# Patient Record
Sex: Male | Born: 1960
Health system: Southern US, Community
[De-identification: ages and names within clinical notes are randomized; demographics above are authoritative.]

## PROBLEM LIST (undated history)

## (undated) ENCOUNTER — Ambulatory Visit: Source: Home / Self Care

## (undated) DIAGNOSIS — R7303 Prediabetes: Secondary | ICD-10-CM

## (undated) DIAGNOSIS — M4300 Spondylolysis, site unspecified: Secondary | ICD-10-CM

## (undated) DIAGNOSIS — I10 Essential (primary) hypertension: Secondary | ICD-10-CM

## (undated) DIAGNOSIS — K760 Fatty (change of) liver, not elsewhere classified: Secondary | ICD-10-CM

## (undated) DIAGNOSIS — M5030 Other cervical disc degeneration, unspecified cervical region: Secondary | ICD-10-CM

## (undated) DIAGNOSIS — M94262 Chondromalacia, left knee: Secondary | ICD-10-CM

## (undated) DIAGNOSIS — M109 Gout, unspecified: Secondary | ICD-10-CM

## (undated) DIAGNOSIS — K759 Inflammatory liver disease, unspecified: Secondary | ICD-10-CM

## (undated) HISTORY — DX: Essential (primary) hypertension: I10

## (undated) HISTORY — DX: Gout, unspecified: M10.9

---

## 2000-05-13 ENCOUNTER — Ambulatory Visit (HOSPITAL_COMMUNITY): Admission: RE | Admit: 2000-05-13 | Discharge: 2000-05-13 | Payer: Self-pay | Admitting: Family Medicine

## 2000-05-13 ENCOUNTER — Encounter: Payer: Self-pay | Admitting: Family Medicine

## 2001-12-31 ENCOUNTER — Ambulatory Visit (HOSPITAL_COMMUNITY): Admission: RE | Admit: 2001-12-31 | Discharge: 2001-12-31 | Payer: Self-pay | Admitting: Family Medicine

## 2001-12-31 ENCOUNTER — Encounter: Payer: Self-pay | Admitting: Family Medicine

## 2008-05-05 ENCOUNTER — Ambulatory Visit: Payer: Self-pay | Admitting: Gastroenterology

## 2008-07-06 ENCOUNTER — Encounter: Admission: RE | Admit: 2008-07-06 | Discharge: 2008-07-06 | Payer: Self-pay | Admitting: Internal Medicine

## 2008-07-27 ENCOUNTER — Ambulatory Visit (HOSPITAL_COMMUNITY): Admission: RE | Admit: 2008-07-27 | Discharge: 2008-07-27 | Payer: Self-pay | Admitting: Gastroenterology

## 2008-07-27 ENCOUNTER — Encounter (INDEPENDENT_AMBULATORY_CARE_PROVIDER_SITE_OTHER): Payer: Self-pay | Admitting: Interventional Radiology

## 2010-05-07 LAB — PROTIME-INR
INR: 1 (ref 0.00–1.49)
Prothrombin Time: 13.7 seconds (ref 11.6–15.2)

## 2010-05-07 LAB — CBC
HCT: 45.2 % (ref 39.0–52.0)
Hemoglobin: 15.4 g/dL (ref 13.0–17.0)
MCHC: 34.2 g/dL (ref 30.0–36.0)
MCV: 88.6 fL (ref 78.0–100.0)
Platelets: 202 10*3/uL (ref 150–400)
RBC: 5.1 MIL/uL (ref 4.22–5.81)
RDW: 12.3 % (ref 11.5–15.5)
WBC: 8.9 10*3/uL (ref 4.0–10.5)

## 2010-05-07 LAB — APTT: aPTT: 29 seconds (ref 24–37)

## 2010-10-15 IMAGING — CR DG CHEST 1V
1 series · 1 of 1 positions shown · non-contrast
Comparison: None

CLINICAL DATA: Pre employment physical.

CHEST - 1 VIEW

[view not recorded]
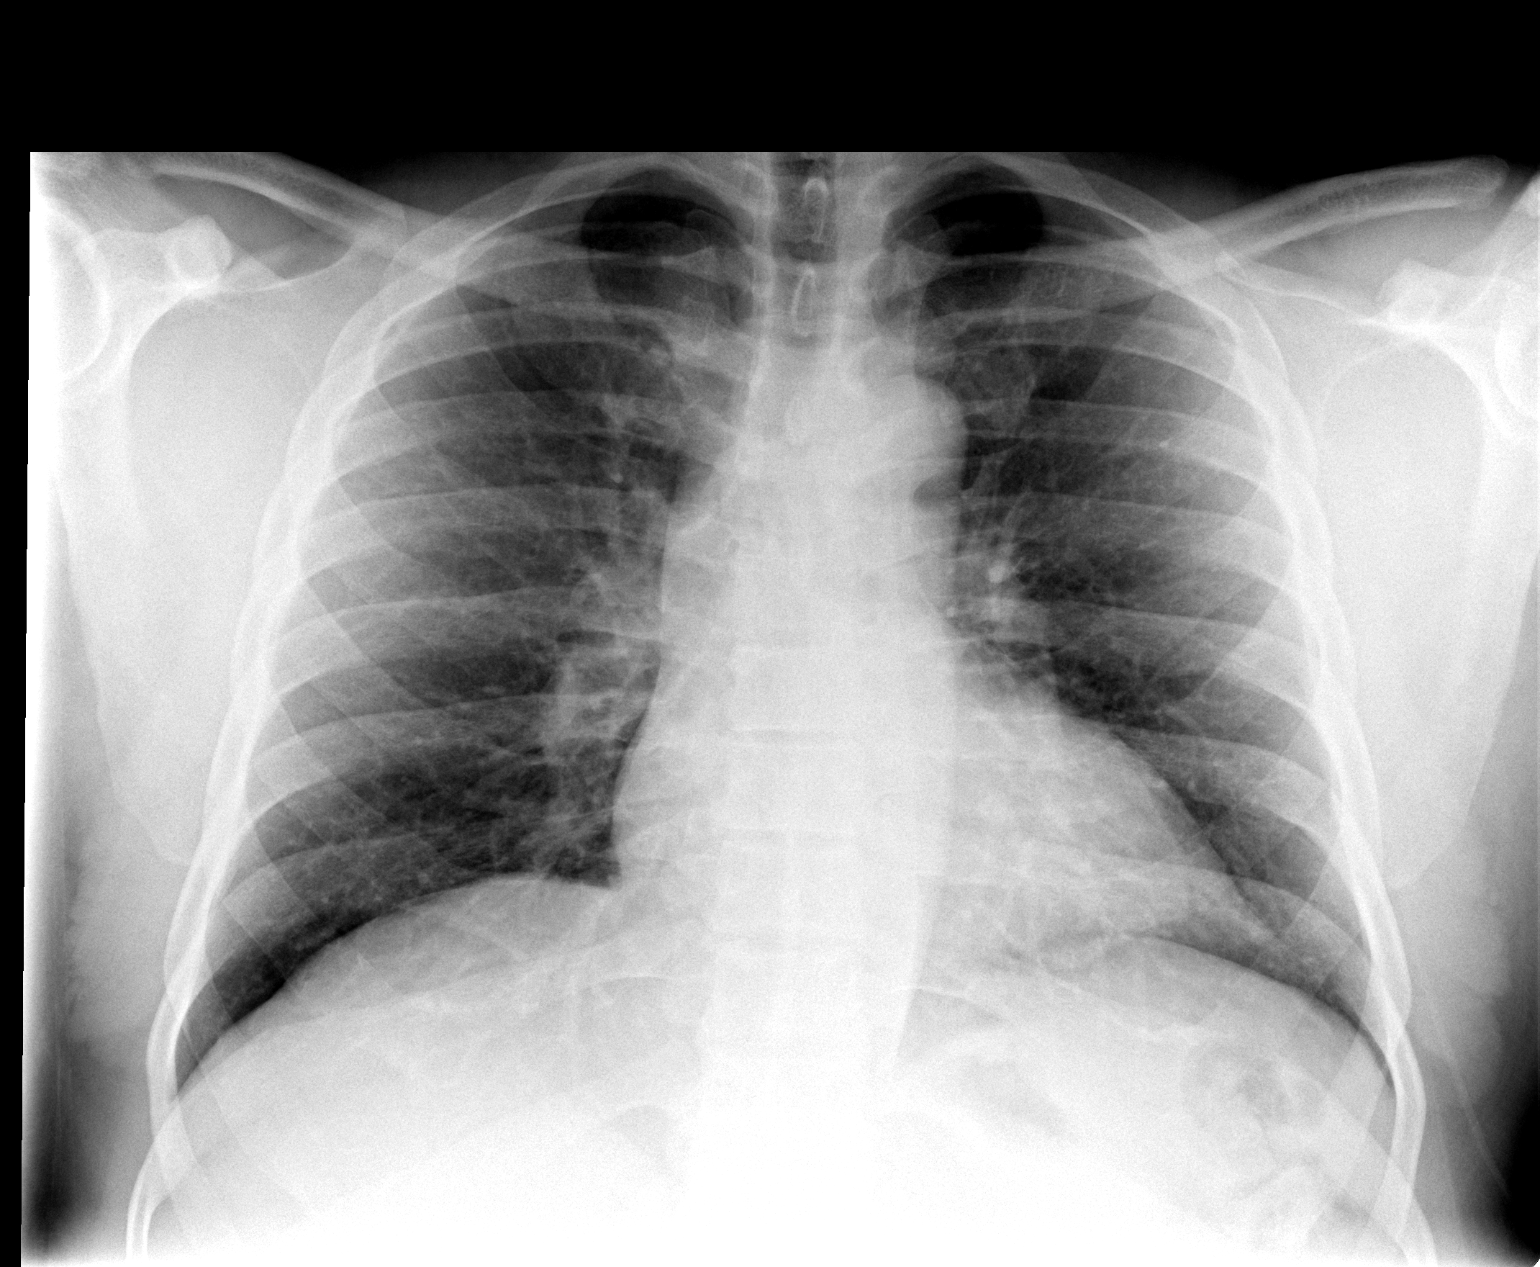

[1 of 1 positions shown; findings below may reference images not displayed]

FINDINGS: Trachea is midline.  Heart size normal.  Lungs are clear.
No pleural fluid.
IMPRESSION: No acute findings.

## 2012-05-20 ENCOUNTER — Other Ambulatory Visit: Payer: Self-pay | Admitting: Internal Medicine

## 2012-05-20 DIAGNOSIS — B1920 Unspecified viral hepatitis C without hepatic coma: Secondary | ICD-10-CM

## 2012-05-25 ENCOUNTER — Ambulatory Visit
Admission: RE | Admit: 2012-05-25 | Discharge: 2012-05-25 | Disposition: A | Discharge status: Home / Self Care | Payer: BC Managed Care – PPO | Source: Ambulatory Visit | Attending: Internal Medicine | Admitting: Internal Medicine

## 2012-05-25 DIAGNOSIS — B1920 Unspecified viral hepatitis C without hepatic coma: Secondary | ICD-10-CM

## 2012-08-02 ENCOUNTER — Ambulatory Visit (INDEPENDENT_AMBULATORY_CARE_PROVIDER_SITE_OTHER): Payer: BC Managed Care – PPO | Admitting: Physician Assistant

## 2012-08-02 VITALS — BP 132/82 | HR 84 | Temp 98.6°F | Resp 16 | Ht 72.0 in | Wt 266.0 lb

## 2012-08-02 DIAGNOSIS — M25572 Pain in left ankle and joints of left foot: Secondary | ICD-10-CM

## 2012-08-02 DIAGNOSIS — M109 Gout, unspecified: Secondary | ICD-10-CM

## 2012-08-02 DIAGNOSIS — M25579 Pain in unspecified ankle and joints of unspecified foot: Secondary | ICD-10-CM

## 2012-08-02 LAB — POCT CBC
Granulocyte percent: 61.7 %G (ref 37–80)
HCT, POC: 46 % (ref 43.5–53.7)
Hemoglobin: 14.9 g/dL (ref 14.1–18.1)
Lymph, poc: 3.1 (ref 0.6–3.4)
MCH, POC: 30.2 pg (ref 27–31.2)
MCHC: 32.4 g/dL (ref 31.8–35.4)
MCV: 93.2 fL (ref 80–97)
MID (cbc): 0.7 (ref 0–0.9)
MPV: 7.7 fL (ref 0–99.8)
POC Granulocyte: 6.1 (ref 2–6.9)
POC LYMPH PERCENT: 31.1 %L (ref 10–50)
POC MID %: 7.2 %M (ref 0–12)
Platelet Count, POC: 164 10*3/uL (ref 142–424)
RBC: 4.94 M/uL (ref 4.69–6.13)
RDW, POC: 12.9 %
WBC: 9.9 10*3/uL (ref 4.6–10.2)

## 2012-08-02 LAB — URIC ACID: Uric Acid, Serum: 7 mg/dL (ref 4.0–7.8)

## 2012-08-02 MED ORDER — HYDROCODONE-ACETAMINOPHEN 5-325 MG PO TABS: 1.0000 | ORAL_TABLET | Freq: Four times a day (QID) | ORAL | Status: DC | PRN

## 2012-08-02 MED ORDER — PREDNISONE 20 MG PO TABS: ORAL_TABLET | ORAL | Status: DC

## 2012-08-02 NOTE — Progress Notes (Signed)
Patient ID: John Calhoun MRN: [id number], DOB: May 27, 1960, 52 y.o. Date of Encounter: 08/02/2012, 10:22 AM  Primary Physician: Leanor Rubenstein, MD  Chief Complaint: Gout flare  HPI: 52 y.o. male with history below presents with pain along the left foot. No injury or trauma. Symptoms began on 07/30/12. Pain is located along the medial great toe and anterior ankle. History of gout along the same foot in the same distribution. No radiation distally or proximally. Afebrile. No chills. Last big gout flare was 2 years ago, however he develops waxing and waning gout flares about every other month that do require him to take ibuprofen.     Past Medical History  Diagnosis Date  . Hypertension      Home Meds: Prior to Admission medications   Medication Sig Start Date End Date Taking? Authorizing Provider  olmesartan (BENICAR) 20 MG tablet Take 20 mg by mouth daily.   Yes Historical Provider, MD                  Allergies: No Known Allergies  History   Social History  . Marital Status: Married    Spouse Name: N/A    Number of Children: N/A  . Years of Education: N/A   Occupational History  . Not on file.   Social History Main Topics  . Smoking status: Never Smoker   . Smokeless tobacco: Not on file  . Alcohol Use: No  . Drug Use: No  . Sexually Active: Not on file   Other Topics Concern  . Not on file   Social History Narrative  . No narrative on file     Review of Systems: Constitutional: negative for chills or fever  HEENT: negative for vision changes or hearing loss Cardiovascular: negative for chest pain or palpitations Abdominal: negative for abdominal pain, nausea, or vomiting Dermatological: see above   Physical Exam: Blood pressure 132/82, pulse 84, temperature 98.6 F (37 C), resp. rate 16, height 6' (1.829 m), weight 266 lb (120.657 kg)., Body mass index is 36.07 kg/(m^2). General: Well developed, well nourished, in no acute distress. Head: Normocephalic,  atraumatic, eyes without discharge, sclera non-icteric, nares are without discharge. Bilateral auditory canals clear, TM's are without perforation, pearly grey and translucent with reflective cone of light bilaterally. Oral cavity moist, posterior pharynx without exudate, erythema, peritonsillar abscess, or post nasal drip.  Neck: Supple. No thyromegaly. Full ROM. No lymphadenopathy. Lungs: Clear bilaterally to auscultation without wheezes, rales, or rhonchi. Breathing is unlabored. Heart: RRR with S1 S2. No murmurs, rubs, or gallops appreciated. Msk:  Strength and tone normal for age. Extremities/Skin: Swollen and mildly erythematous left foot. TTP along the medial 1st MTP and anterior ankle. He states this is where is previous gout flares have been. Distal pulses 2+ and cap refill less than 2 seconds through out. Warm and dry. No clubbing or cyanosis. No edema. No rashes, wounds, or suspicious lesions. Neuro: Alert and oriented X 3. Moves all extremities spontaneously. Gait is with crutches. CNII-XII grossly in tact. Psych:  Responds to questions appropriately with a normal affect.   Labs: Uric acid pending.  ASSESSMENT AND PLAN:  52 y.o. year old male with gout of left foot. -Prednisone 20 mg #18 3x3, 2x3, 1x3 no RF -Norco 5/325 mg 1 po q 4-6 hours prn pain #30 no RF, SED -Await labs -Diet modifications discussed as patient was leaving -Patient to RTC in 2 weeks for repeat uric acid and likely ULT -RTC precautions  Signed, Eula Listen,  PA-C 08/02/2012 10:22 AM

## 2012-08-03 ENCOUNTER — Encounter: Payer: Self-pay | Admitting: Family Medicine

## 2012-08-19 ENCOUNTER — Ambulatory Visit (INDEPENDENT_AMBULATORY_CARE_PROVIDER_SITE_OTHER): Payer: BC Managed Care – PPO | Admitting: Physician Assistant

## 2012-08-19 VITALS — BP 112/82 | HR 72 | Temp 97.9°F | Resp 18 | Ht 72.0 in | Wt 261.0 lb

## 2012-08-19 DIAGNOSIS — M25572 Pain in left ankle and joints of left foot: Secondary | ICD-10-CM

## 2012-08-19 DIAGNOSIS — M109 Gout, unspecified: Secondary | ICD-10-CM

## 2012-08-19 MED ORDER — COLCHICINE 0.6 MG PO TABS: 0.6000 mg | ORAL_TABLET | Freq: Every day | ORAL | Status: DC

## 2012-08-19 MED ORDER — ALLOPURINOL 100 MG PO TABS: 100.0000 mg | ORAL_TABLET | Freq: Every day | ORAL | Status: AC

## 2012-08-19 NOTE — Progress Notes (Signed)
   Patient ID: John Calhoun MRN: [id number], DOB: 18-Jan-1961, 52 y.o. Date of Encounter: 08/19/2012, 1:17 PM  Primary Physician: Leanor Rubenstein, MD  Chief Complaint: Gout follow up  HPI: 52 y.o. male with history below presents for follow up of gout. See office visit from 08/02/12. Doing well from acute flare. Finished prednisone taper. No further pain. Able to wear regular shoes and ambulate without difficulty. Has changed his diet to not include red meat or shell fish. Last uric acid 7.0 on 08/02/12. Here today to get repeat uric acid and start ULT.     Past Medical History  Diagnosis Date  . Hypertension   . Gout      Home Meds: Prior to Admission medications   Medication Sig Start Date End Date Taking? Authorizing Provider  olmesartan (BENICAR) 20 MG tablet Take 20 mg by mouth daily.   Yes Historical Provider, MD                  Allergies: No Known Allergies  History   Social History  . Marital Status: Married    Spouse Name: N/A    Number of Children: N/A  . Years of Education: N/A   Occupational History  . Not on file.   Social History Main Topics  . Smoking status: Never Smoker   . Smokeless tobacco: Not on file  . Alcohol Use: No  . Drug Use: No  . Sexually Active: Not on file   Other Topics Concern  . Not on file   Social History Narrative  . No narrative on file     Review of Systems: Constitutional: negative for chills, fever, or fatigue  Cardiovascular: negative for chest pain or palpitations Dermatological: see above   Physical Exam: Blood pressure 112/82, pulse 72, temperature 97.9 F (36.6 C), temperature source Oral, resp. rate 18, height 6' (1.829 m), weight 261 lb (118.389 kg), SpO2 96.00%., Body mass index is 35.39 kg/(m^2). General: Well developed, well nourished, in no acute distress. Head: Normocephalic, atraumatic, eyes without discharge, sclera non-icteric, nares are without discharge.   Neck: Supple. Full ROM.  Lungs: Clear bilaterally  to auscultation without wheezes, rales, or rhonchi. Breathing is unlabored. Heart: RRR with S1 S2. No murmurs, rubs, or gallops appreciated. Msk:  Strength and tone normal for age. Extremities/Skin: Left foot with resolved acute gout flare. No further TTP. FROM. No erythema, or STS. Warm and dry. No clubbing or cyanosis. No edema. No rashes or suspicious lesions. Neuro: Alert and oriented X 3. Moves all extremities spontaneously. Gait is normal. CNII-XII grossly in tact. Psych:  Responds to questions appropriately with a normal affect.   Labs: Uric acid and BMP pending  ASSESSMENT AND PLAN:  52 y.o. year old male with gout  -Start Allopurinol 100 mg 1 po daily #30 RF 3 -Continue Colcrys 0.6mg  #30 1 po daily no RF -Await labs -Continue to titrate ULT until goal uric acid less than 6 -Diet modifications discussed  -Recheck 4-8 weeks  Signed, Eula Listen, PA-C 08/19/2012 1:17 PM

## 2012-08-20 LAB — URIC ACID: Uric Acid, Serum: 9 mg/dL — ABNORMAL HIGH (ref 4.0–7.8)

## 2012-08-20 LAB — BASIC METABOLIC PANEL WITH GFR
BUN: 11 mg/dL (ref 6–23)
CO2: 25 meq/L (ref 19–32)
Calcium: 9.6 mg/dL (ref 8.4–10.5)
Chloride: 106 meq/L (ref 96–112)
Creat: 0.9 mg/dL (ref 0.50–1.35)
Glucose, Bld: 106 mg/dL — ABNORMAL HIGH (ref 70–99)
Potassium: 4.4 meq/L (ref 3.5–5.3)
Sodium: 140 meq/L (ref 135–145)

## 2013-03-18 ENCOUNTER — Other Ambulatory Visit: Payer: Self-pay | Admitting: *Deleted

## 2013-03-18 ENCOUNTER — Other Ambulatory Visit: Payer: Self-pay | Admitting: Internal Medicine

## 2013-03-18 DIAGNOSIS — C220 Liver cell carcinoma: Secondary | ICD-10-CM

## 2013-03-24 ENCOUNTER — Ambulatory Visit
Admission: RE | Admit: 2013-03-24 | Discharge: 2013-03-24 | Disposition: A | Discharge status: Home / Self Care | Payer: BC Managed Care – PPO | Source: Ambulatory Visit | Attending: Internal Medicine | Admitting: Internal Medicine

## 2013-03-24 DIAGNOSIS — C220 Liver cell carcinoma: Secondary | ICD-10-CM

## 2015-01-29 HISTORY — PX: CERVICAL FUSION: SHX112

## 2016-03-24 ENCOUNTER — Emergency Department (HOSPITAL_COMMUNITY): Payer: 59

## 2016-03-24 ENCOUNTER — Emergency Department (HOSPITAL_COMMUNITY)
Admission: EM | Admit: 2016-03-24 | Discharge: 2016-03-24 | Disposition: A | Discharge status: Home / Self Care | Payer: 59 | Attending: Emergency Medicine | Admitting: Emergency Medicine

## 2016-03-24 ENCOUNTER — Encounter (HOSPITAL_COMMUNITY): Payer: Self-pay | Admitting: Emergency Medicine

## 2016-03-24 DIAGNOSIS — Y999 Unspecified external cause status: Secondary | ICD-10-CM | POA: Insufficient documentation

## 2016-03-24 DIAGNOSIS — Y939 Activity, unspecified: Secondary | ICD-10-CM | POA: Diagnosis not present

## 2016-03-24 DIAGNOSIS — Y929 Unspecified place or not applicable: Secondary | ICD-10-CM | POA: Insufficient documentation

## 2016-03-24 DIAGNOSIS — S161XXA Strain of muscle, fascia and tendon at neck level, initial encounter: Secondary | ICD-10-CM | POA: Insufficient documentation

## 2016-03-24 DIAGNOSIS — S199XXA Unspecified injury of neck, initial encounter: Secondary | ICD-10-CM | POA: Diagnosis present

## 2016-03-24 DIAGNOSIS — I10 Essential (primary) hypertension: Secondary | ICD-10-CM | POA: Insufficient documentation

## 2016-03-24 DIAGNOSIS — X58XXXA Exposure to other specified factors, initial encounter: Secondary | ICD-10-CM | POA: Insufficient documentation

## 2016-03-24 MED ORDER — DIAZEPAM 5 MG PO TABS: 5.0000 mg | ORAL_TABLET | Freq: Two times a day (BID) | ORAL | 0 refills | Status: DC

## 2016-03-24 MED ORDER — PREDNISONE 10 MG (21) PO TBPK: ORAL_TABLET | ORAL | 0 refills | Status: DC

## 2016-03-24 NOTE — ED Triage Notes (Signed)
Pt sts left sided neck and shoulder pain x 6 days worse with movement

## 2016-03-24 NOTE — Discharge Instructions (Signed)
See your Physician for recheck this week.   °

## 2016-03-24 NOTE — ED Triage Notes (Signed)
PT reports he went to his PCP on Thursday . Pt received Rx's for Naproxen 500mg , Flexeril  and Skelaxin 800mg . There has been no improvement in the Lt sided neck pain. Pt reports he can only sleep on LT sided and this has caused Lt arm numbness.

## 2016-03-24 NOTE — ED Provider Notes (Signed)
[name] DEPT Provider Note   CSN: [id number] Arrival date & time: 03/24/16  [id number]   By signing my name below, I, Eunice Blase, attest that this documentation has been prepared under the direction and in the presence of Alyse Low, [place]. Electronically Signed: Eunice Blase, Scribe. 03/24/16. 9:56 AM.   History   Chief Complaint Chief Complaint  Patient presents with  . Neck Pain   The history is provided by the patient and medical records. No language interpreter was used.    HPI Comments: John Calhoun is a 56 y.o. male with Hx of left shoulder surgery who presents to the Emergency Department complaining of left sided neck and shoulder pain x 1 week. He states his pain normally goes away but has not this time. He adds he went to his PCP for the issue and received naproxen and flexeril without relief. Pt states his past shoulder surgery was to fix an issue brought on by weightlifting. He denies activity change, strange sleeping position and injury.  Past Medical History:  Diagnosis Date  . Gout   . Hypertension     There are no active problems to display for this patient.   History reviewed. No pertinent surgical history.     Home Medications    Prior to Admission medications   Medication Sig Start Date End Date Taking? Authorizing Provider  allopurinol (ZYLOPRIM) 100 MG tablet Take 1 tablet (100 mg total) by mouth daily. 08/19/12   Rise Mu, PA-C  colchicine 0.6 MG tablet Take 1 tablet (0.6 mg total) by mouth daily. 08/19/12   Areta Haber Dunn, PA-C  olmesartan (BENICAR) 20 MG tablet Take 20 mg by mouth daily.    Historical Provider, MD    Family History Family History  Problem Relation Age of Onset  . Heart disease Brother     Social History Social History  Substance Use Topics  . Smoking status: Never Smoker  . Smokeless tobacco: Not on file  . Alcohol use No     Allergies   Patient has no known allergies.   Review of Systems Review of Systems    Musculoskeletal: Positive for arthralgias, myalgias and neck pain.  Skin: Negative for wound.  Neurological: Negative for weakness and numbness.  All other systems reviewed and are negative.    Physical Exam Updated Vital Signs BP (!) 167/104 (BP Location: Left Arm) Comment: Pt states did not take HTN meds this morning  Pulse 91   Temp 98.2 F (36.8 C) (Oral)   Resp 16   SpO2 97%   Physical Exam  Constitutional: He is oriented to person, place, and time. He appears well-developed and well-nourished.  HENT:  Head: Normocephalic and atraumatic.  Eyes: EOM are normal. Pupils are equal, round, and reactive to light.  Neck: Normal range of motion. Neck supple. No JVD present.  Cardiovascular: Normal rate and regular rhythm.  Exam reveals no gallop and no friction rub.   No murmur heard. Pulmonary/Chest: No respiratory distress. He has no wheezes.  Abdominal: He exhibits no distension. There is no rebound and no guarding.  Musculoskeletal: Normal range of motion. He exhibits tenderness.  Pain with ROM of the shoulder and neck  Neurological: He is alert and oriented to person, place, and time.  Skin: No rash noted. No pallor.  Psychiatric: He has a normal mood and affect. His behavior is normal.  Nursing note and vitals reviewed.    ED Treatments / Results  DIAGNOSTIC STUDIES: Oxygen Saturation is 97% on  RA, normal by my interpretation.    COORDINATION OF CARE: 9:37 AM Discussed treatment plan with pt at bedside and pt agreed to plan. Will order imaging and reassess.  Labs (all labs ordered are listed, but only abnormal results are displayed) Labs Reviewed - No data to display  EKG  EKG Interpretation None       Radiology No results found.  Procedures Procedures (including critical care time)  Medications Ordered in ED Medications - No data to display   Initial Impression / Assessment and Plan / ED Course  I have reviewed the triage vital signs and the  nursing notes.  Pertinent labs & imaging results that were available during my care of the patient were reviewed by me and considered in my medical decision making (see chart for details).       Final Clinical Impressions(s) / ED Diagnoses   Final diagnoses:  Acute strain of neck muscle, initial encounter    New Prescriptions New Prescriptions   DIAZEPAM (VALIUM) 5 MG TABLET    Take 1 tablet (5 mg total) by mouth 2 (two) times daily.   PREDNISONE (STERAPRED UNI-PAK 21 TAB) 10 MG (21) TBPK TABLET    6,5,4,3,2,1 taper  An After Visit Summary was printed and given to the patient.  I personally performed the services in this documentation, which was scribed in my presence.  The recorded information has been reviewed and considered.   Ronnald Collum.   Hollace Kinnier [place], PA-C 03/24/16 [place], MD 03/24/16 [phone]

## 2016-03-24 NOTE — ED Notes (Signed)
Declined W/C at D/C and was escorted to lobby by RN. 

## 2016-04-01 ENCOUNTER — Other Ambulatory Visit: Payer: Self-pay | Admitting: Family Medicine

## 2016-04-01 DIAGNOSIS — M542 Cervicalgia: Secondary | ICD-10-CM

## 2016-04-06 ENCOUNTER — Other Ambulatory Visit: Payer: 59

## 2016-04-09 ENCOUNTER — Ambulatory Visit
Admission: RE | Admit: 2016-04-09 | Discharge: 2016-04-09 | Disposition: A | Discharge status: Home / Self Care | Payer: 59 | Source: Ambulatory Visit | Attending: Family Medicine | Admitting: Family Medicine

## 2016-04-09 DIAGNOSIS — M542 Cervicalgia: Secondary | ICD-10-CM

## 2017-07-02 DIAGNOSIS — I10 Essential (primary) hypertension: Secondary | ICD-10-CM | POA: Diagnosis not present

## 2017-07-02 DIAGNOSIS — M109 Gout, unspecified: Secondary | ICD-10-CM | POA: Diagnosis not present

## 2017-07-14 ENCOUNTER — Ambulatory Visit (HOSPITAL_COMMUNITY)
Admission: RE | Admit: 2017-07-14 | Discharge: 2017-07-14 | Disposition: A | Discharge status: Home / Self Care | Payer: BLUE CROSS/BLUE SHIELD | Source: Ambulatory Visit | Attending: Family Medicine | Admitting: Family Medicine

## 2017-07-14 ENCOUNTER — Other Ambulatory Visit (HOSPITAL_COMMUNITY): Payer: Self-pay | Admitting: Family Medicine

## 2017-07-14 DIAGNOSIS — M79605 Pain in left leg: Secondary | ICD-10-CM

## 2017-07-14 DIAGNOSIS — M7989 Other specified soft tissue disorders: Secondary | ICD-10-CM | POA: Diagnosis not present

## 2017-07-14 NOTE — Progress Notes (Signed)
LLE venous duplex prelim: negative for DVT. Landry Mellow, RDMS, RVT     Attempted to call results to Dr. Tamala Julian with no answer or voicemail at [phone]. Will let patient leave.

## 2017-07-19 DIAGNOSIS — M25562 Pain in left knee: Secondary | ICD-10-CM | POA: Diagnosis not present

## 2017-08-19 DIAGNOSIS — M25462 Effusion, left knee: Secondary | ICD-10-CM | POA: Diagnosis not present

## 2017-08-19 DIAGNOSIS — M25562 Pain in left knee: Secondary | ICD-10-CM | POA: Diagnosis not present

## 2017-08-22 DIAGNOSIS — M25562 Pain in left knee: Secondary | ICD-10-CM | POA: Diagnosis not present

## 2017-09-02 ENCOUNTER — Other Ambulatory Visit: Payer: Self-pay | Admitting: Orthopedic Surgery

## 2017-09-19 ENCOUNTER — Encounter (HOSPITAL_BASED_OUTPATIENT_CLINIC_OR_DEPARTMENT_OTHER): Payer: Self-pay

## 2017-09-22 ENCOUNTER — Encounter (HOSPITAL_BASED_OUTPATIENT_CLINIC_OR_DEPARTMENT_OTHER): Payer: Self-pay | Admitting: *Deleted

## 2017-09-22 ENCOUNTER — Other Ambulatory Visit: Payer: Self-pay

## 2017-09-22 NOTE — Progress Notes (Signed)
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

## 2017-09-25 NOTE — H&P (Addendum)
PREOPERATIVE H&P  Chief Complaint: left knee pain  HPI: John Calhoun is a 57 y.o. male who presents for evaluation of l knee pain. It has been present for greater than 3 months and has been worsening. He has failed conservative measures. Pain is rated as moderate.  Past Medical History:  Diagnosis Date  . Chondromalacia of left knee   . DDD (degenerative disc disease), cervical   . Fatty liver    resolved now  . Gout   . Hepatitis    hepatitis c took harvoni 2016  . Hypertension   . Pre-diabetes    does not check cbg at home, hemaglobin a1c runs good per patient  . Spondylolysis    Cervical   Past Surgical History:  Procedure Laterality Date  . CERVICAL FUSION  2017   C 3    Social History   Socioeconomic History  . Marital status: Married    Spouse name: Not on file  . Number of children: Not on file  . Years of education: Not on file  . Highest education level: Not on file  Occupational History  . Not on file  Social Needs  . Financial resource strain: Not on file  . Food insecurity:    Worry: Not on file    Inability: Not on file  . Transportation needs:    Medical: Not on file    Non-medical: Not on file  Tobacco Use  . Smoking status: Former Smoker    Packs/day: 1.50    Years: 20.00    Pack years: 30.00    Types: Cigarettes  . Smokeless tobacco: Never Used  . Tobacco comment: quit 21 yrs ago  Substance and Sexual Activity  . Alcohol use: No  . Drug use: No  . Sexual activity: Not on file  Lifestyle  . Physical activity:    Days per week: Not on file    Minutes per session: Not on file  . Stress: Not on file  Relationships  . Social connections:    Talks on phone: Not on file    Gets together: Not on file    Attends religious service: Not on file    Active member of club or organization: Not on file    Attends meetings of clubs or organizations: Not on file    Relationship status: Not on file  Other Topics Concern  . Not on file  Social  History Narrative  . Not on file   Family History  Problem Relation Age of Onset  . Heart disease Brother    No Known Allergies Prior to Admission medications   Medication Sig Start Date End Date Taking? Authorizing Provider  allopurinol (ZYLOPRIM) 100 MG tablet Take 1 tablet (100 mg total) by mouth daily. 08/19/12   Rise Mu, PA-C  colchicine 0.6 MG tablet Take 1 tablet (0.6 mg total) by mouth daily. 08/19/12   Dunn, Areta Haber, PA-C  olmesartan (BENICAR) 20 MG tablet Take 20 mg by mouth daily.    [provider]     Positive ROS: none  All other systems have been reviewed and were otherwise negative with the exception of those mentioned in the HPI and as above.  Physical Exam: There were no vitals filed for this visit. Vitals:   09/26/17 0735  BP: 140/85  Pulse: 65  Resp: 16  Temp: 97.6 F (36.4 C)  SpO2: 98%    General: Alert, no acute distress Cardiovascular: No pedal edema Respiratory: No cyanosis, no  use of accessory musculature GI: No organomegaly, abdomen is soft and non-tender Skin: No lesions in the area of chief complaint Neurologic: Sensation intact distally Psychiatric: Patient is competent for consent with normal mood and affect Lymphatic: No axillary or cervical lymphadenopathy  MUSCULOSKELETAL: l knee: painful rom limited rom no instability trace effusion  Assessment/Plan: LEFT KNEE CHONDROMALACIA Plan for Procedure(s): ARTHROSCOPY LEFT  KNEE  The risks benefits and alternatives were discussed with the patient including but not limited to the risks of nonoperative treatment, versus surgical intervention including infection, bleeding, nerve injury, malunion, nonunion, hardware prominence, hardware failure, need for hardware removal, blood clots, cardiopulmonary complications, morbidity, mortality, among others, and they were willing to proceed.  Predicted outcome is good, although there will be at least a six to nine month expected  recovery.  There is been no change in interval history and physical examination overnight. Alta Corning, MD 09/25/2017 9:57 PM

## 2017-09-26 ENCOUNTER — Other Ambulatory Visit: Payer: Self-pay

## 2017-09-26 ENCOUNTER — Ambulatory Visit (HOSPITAL_BASED_OUTPATIENT_CLINIC_OR_DEPARTMENT_OTHER): Payer: BLUE CROSS/BLUE SHIELD | Admitting: Anesthesiology

## 2017-09-26 ENCOUNTER — Encounter (HOSPITAL_BASED_OUTPATIENT_CLINIC_OR_DEPARTMENT_OTHER): Payer: Self-pay | Admitting: Anesthesiology

## 2017-09-26 ENCOUNTER — Ambulatory Visit (HOSPITAL_BASED_OUTPATIENT_CLINIC_OR_DEPARTMENT_OTHER)
Admission: RE | Admit: 2017-09-26 | Discharge: 2017-09-26 | Disposition: A | Discharge status: Home / Self Care | Payer: BLUE CROSS/BLUE SHIELD | Source: Ambulatory Visit | Attending: Orthopedic Surgery | Admitting: Orthopedic Surgery

## 2017-09-26 ENCOUNTER — Encounter (HOSPITAL_BASED_OUTPATIENT_CLINIC_OR_DEPARTMENT_OTHER)
Admission: RE | Disposition: A | Discharge status: Home / Self Care | Payer: Self-pay | Source: Ambulatory Visit | Attending: Orthopedic Surgery

## 2017-09-26 DIAGNOSIS — M6752 Plica syndrome, left knee: Secondary | ICD-10-CM | POA: Insufficient documentation

## 2017-09-26 DIAGNOSIS — B1920 Unspecified viral hepatitis C without hepatic coma: Secondary | ICD-10-CM | POA: Insufficient documentation

## 2017-09-26 DIAGNOSIS — S83249A Other tear of medial meniscus, current injury, unspecified knee, initial encounter: Secondary | ICD-10-CM

## 2017-09-26 DIAGNOSIS — M23222 Derangement of posterior horn of medial meniscus due to old tear or injury, left knee: Secondary | ICD-10-CM | POA: Insufficient documentation

## 2017-09-26 DIAGNOSIS — Z87891 Personal history of nicotine dependence: Secondary | ICD-10-CM | POA: Diagnosis not present

## 2017-09-26 DIAGNOSIS — Z981 Arthrodesis status: Secondary | ICD-10-CM | POA: Diagnosis not present

## 2017-09-26 DIAGNOSIS — Z79899 Other long term (current) drug therapy: Secondary | ICD-10-CM | POA: Insufficient documentation

## 2017-09-26 DIAGNOSIS — M1990 Unspecified osteoarthritis, unspecified site: Secondary | ICD-10-CM | POA: Diagnosis not present

## 2017-09-26 DIAGNOSIS — I10 Essential (primary) hypertension: Secondary | ICD-10-CM | POA: Insufficient documentation

## 2017-09-26 DIAGNOSIS — M94262 Chondromalacia, left knee: Secondary | ICD-10-CM | POA: Diagnosis not present

## 2017-09-26 DIAGNOSIS — M2242 Chondromalacia patellae, left knee: Secondary | ICD-10-CM | POA: Diagnosis not present

## 2017-09-26 DIAGNOSIS — Z8249 Family history of ischemic heart disease and other diseases of the circulatory system: Secondary | ICD-10-CM | POA: Diagnosis not present

## 2017-09-26 HISTORY — DX: Spondylolysis, site unspecified: M43.00

## 2017-09-26 HISTORY — DX: Inflammatory liver disease, unspecified: K75.9

## 2017-09-26 HISTORY — DX: Chondromalacia, left knee: M94.262

## 2017-09-26 HISTORY — DX: Fatty (change of) liver, not elsewhere classified: K76.0

## 2017-09-26 HISTORY — PX: KNEE ARTHROSCOPY: SHX127

## 2017-09-26 HISTORY — DX: Prediabetes: R73.03

## 2017-09-26 HISTORY — DX: Other cervical disc degeneration, unspecified cervical region: M50.30

## 2017-09-26 LAB — POCT I-STAT, CHEM 8
BUN: 10 mg/dL (ref 6–20)
Calcium, Ion: 1.25 mmol/L (ref 1.15–1.40)
Chloride: 105 mmol/L (ref 98–111)
Creatinine, Ser: 0.9 mg/dL (ref 0.61–1.24)
Glucose, Bld: 102 mg/dL — ABNORMAL HIGH (ref 70–99)
HCT: 43 % (ref 39.0–52.0)
Hemoglobin: 14.6 g/dL (ref 13.0–17.0)
Potassium: 3.6 mmol/L (ref 3.5–5.1)
Sodium: 142 mmol/L (ref 135–145)
TCO2: 24 mmol/L (ref 22–32)

## 2017-09-26 SURGERY — ARTHROSCOPY, KNEE
Anesthesia: General | Site: Knee | Laterality: Left

## 2017-09-26 MED ORDER — FENTANYL CITRATE (PF) 100 MCG/2ML IJ SOLN
INTRAMUSCULAR | Status: AC
  Filled 2017-09-26: qty 2

## 2017-09-26 MED ORDER — LIDOCAINE 2% (20 MG/ML) 5 ML SYRINGE
INTRAMUSCULAR | Status: DC | PRN
  Administered 2017-09-26: 100 mg via INTRAVENOUS

## 2017-09-26 MED ORDER — MIDAZOLAM HCL 5 MG/5ML IJ SOLN
INTRAMUSCULAR | Status: DC | PRN
  Administered 2017-09-26: 2 mg via INTRAVENOUS

## 2017-09-26 MED ORDER — PROPOFOL 10 MG/ML IV BOLUS
INTRAVENOUS | Status: AC
  Filled 2017-09-26: qty 20

## 2017-09-26 MED ORDER — LACTATED RINGERS IV SOLN
INTRAVENOUS | Status: DC
  Administered 2017-09-26: 09:00:00 via INTRAVENOUS
  Filled 2017-09-26: qty 1000

## 2017-09-26 MED ORDER — LACTATED RINGERS IV SOLN
INTRAVENOUS | Status: DC
  Filled 2017-09-26: qty 1000

## 2017-09-26 MED ORDER — ONDANSETRON HCL 4 MG/2ML IJ SOLN
INTRAMUSCULAR | Status: DC | PRN
  Administered 2017-09-26: 4 mg via INTRAVENOUS

## 2017-09-26 MED ORDER — DEXAMETHASONE SODIUM PHOSPHATE 10 MG/ML IJ SOLN
INTRAMUSCULAR | Status: AC
  Filled 2017-09-26: qty 1

## 2017-09-26 MED ORDER — DEXAMETHASONE SODIUM PHOSPHATE 4 MG/ML IJ SOLN
INTRAMUSCULAR | Status: DC | PRN
  Administered 2017-09-26: 10 mg via INTRAVENOUS

## 2017-09-26 MED ORDER — CLINDAMYCIN PHOSPHATE 900 MG/50ML IV SOLN
INTRAVENOUS | Status: AC
  Filled 2017-09-26: qty 50

## 2017-09-26 MED ORDER — OXYCODONE HCL 5 MG/5ML PO SOLN
5.0000 mg | Freq: Once | ORAL | Status: DC | PRN
  Filled 2017-09-26: qty 5

## 2017-09-26 MED ORDER — PROMETHAZINE HCL 25 MG/ML IJ SOLN
6.2500 mg | INTRAMUSCULAR | Status: DC | PRN
  Filled 2017-09-26: qty 1

## 2017-09-26 MED ORDER — MIDAZOLAM HCL 2 MG/2ML IJ SOLN
INTRAMUSCULAR | Status: AC
  Filled 2017-09-26: qty 2

## 2017-09-26 MED ORDER — HYDROMORPHONE HCL 1 MG/ML IJ SOLN
0.2500 mg | INTRAMUSCULAR | Status: DC | PRN
  Administered 2017-09-26 (×2): 0.5 mg via INTRAVENOUS
  Filled 2017-09-26: qty 0.5

## 2017-09-26 MED ORDER — MEPERIDINE HCL 25 MG/ML IJ SOLN
6.2500 mg | INTRAMUSCULAR | Status: DC | PRN
  Filled 2017-09-26: qty 1

## 2017-09-26 MED ORDER — PROPOFOL 10 MG/ML IV BOLUS
INTRAVENOUS | Status: DC | PRN
  Administered 2017-09-26: 200 mg via INTRAVENOUS

## 2017-09-26 MED ORDER — OXYCODONE HCL 5 MG PO TABS
5.0000 mg | ORAL_TABLET | Freq: Once | ORAL | Status: DC | PRN
  Filled 2017-09-26: qty 1

## 2017-09-26 MED ORDER — EPINEPHRINE PF 1 MG/ML IJ SOLN
INTRAMUSCULAR | Status: DC | PRN
  Administered 2017-09-26: 1 mL

## 2017-09-26 MED ORDER — HYDROMORPHONE HCL 1 MG/ML IJ SOLN
INTRAMUSCULAR | Status: AC
  Filled 2017-09-26: qty 1

## 2017-09-26 MED ORDER — SODIUM CHLORIDE 0.9 % IR SOLN
Status: DC | PRN
  Administered 2017-09-26: 3000 mL

## 2017-09-26 MED ORDER — LIDOCAINE 2% (20 MG/ML) 5 ML SYRINGE
INTRAMUSCULAR | Status: AC
  Filled 2017-09-26: qty 5

## 2017-09-26 MED ORDER — CLINDAMYCIN PHOSPHATE 900 MG/50ML IV SOLN
900.0000 mg | INTRAVENOUS | Status: AC
  Administered 2017-09-26: 900 mg via INTRAVENOUS
  Filled 2017-09-26: qty 50

## 2017-09-26 MED ORDER — HYDROCODONE-ACETAMINOPHEN 5-325 MG PO TABS: 1.0000 | ORAL_TABLET | Freq: Four times a day (QID) | ORAL | 0 refills | Status: AC | PRN

## 2017-09-26 MED ORDER — FENTANYL CITRATE (PF) 100 MCG/2ML IJ SOLN
INTRAMUSCULAR | Status: DC | PRN
  Administered 2017-09-26: 50 ug via INTRAVENOUS

## 2017-09-26 MED ORDER — ONDANSETRON HCL 4 MG/2ML IJ SOLN
INTRAMUSCULAR | Status: AC
  Filled 2017-09-26: qty 2

## 2017-09-26 MED ORDER — BUPIVACAINE HCL (PF) 0.25 % IJ SOLN
INTRAMUSCULAR | Status: DC | PRN
  Administered 2017-09-26: 20 mL

## 2017-09-26 MED ORDER — CHLORHEXIDINE GLUCONATE 4 % EX LIQD
60.0000 mL | Freq: Once | CUTANEOUS | Status: DC
  Filled 2017-09-26: qty 118

## 2017-09-26 SURGICAL SUPPLY — 44 items
BANDAGE ACE 6X5 VEL STRL LF (GAUZE/BANDAGES/DRESSINGS) ×1 IMPLANT
BANDAGE ELASTIC 6 VELCRO ST LF (GAUZE/BANDAGES/DRESSINGS) ×2 IMPLANT
BLADE 4.2CUDA (BLADE) ×2 IMPLANT
BLADE CUTTER GATOR 3.5 (BLADE) ×1 IMPLANT
BLADE GREAT WHITE 4.2 (BLADE) IMPLANT
BOOTIES KNEE HIGH SLOAN (MISCELLANEOUS) ×4 IMPLANT
CANISTER SUCT 3000ML PPV (MISCELLANEOUS) IMPLANT
CANISTER SUCTION 1200CC (MISCELLANEOUS) IMPLANT
CLOTH BEACON ORANGE TIMEOUT ST (SAFETY) ×2 IMPLANT
CUTTER MENISCUS  4.2MM (BLADE)
CUTTER MENISCUS 4.2MM (BLADE) IMPLANT
DRAPE ARTHROSCOPY W/POUCH 114 (DRAPES) ×2 IMPLANT
DRSG ADAPTIC 3X8 NADH LF (GAUZE/BANDAGES/DRESSINGS) ×2 IMPLANT
DRSG EMULSION OIL 3X3 NADH (GAUZE/BANDAGES/DRESSINGS) ×1 IMPLANT
DURAPREP 26ML APPLICATOR (WOUND CARE) ×2 IMPLANT
ELECT MENISCUS 165MM 90D (ELECTRODE) ×2 IMPLANT
ELECT REM PT RETURN 9FT ADLT (ELECTROSURGICAL)
ELECTRODE REM PT RTRN 9FT ADLT (ELECTROSURGICAL) IMPLANT
GAUZE SPONGE 4X4 12PLY STRL (GAUZE/BANDAGES/DRESSINGS) ×2 IMPLANT
GLOVE ECLIPSE 7.5 STRL STRAW (GLOVE) ×4 IMPLANT
GLOVE INDICATOR 8.0 STRL GRN (GLOVE) ×4 IMPLANT
GOWN STRL REUS W/TWL LRG LVL3 (GOWN DISPOSABLE) ×2 IMPLANT
GOWN STRL REUS W/TWL XL LVL3 (GOWN DISPOSABLE) ×2 IMPLANT
KIT TURNOVER CYSTO (KITS) ×2 IMPLANT
KNEE WRAP E Z 3 GEL PACK (MISCELLANEOUS) ×2 IMPLANT
MANIFOLD NEPTUNE II (INSTRUMENTS) IMPLANT
NDL FILTER BLUNT 18X1 1/2 (NEEDLE) IMPLANT
NDL SAFETY ECLIPSE 18X1.5 (NEEDLE) IMPLANT
NEEDLE FILTER BLUNT 18X 1/2SAF (NEEDLE)
NEEDLE FILTER BLUNT 18X1 1/2 (NEEDLE) IMPLANT
NEEDLE HYPO 18GX1.5 SHARP (NEEDLE)
PACK ARTHROSCOPY DSU (CUSTOM PROCEDURE TRAY) ×2 IMPLANT
PACK BASIN DAY SURGERY FS (CUSTOM PROCEDURE TRAY) ×2 IMPLANT
PAD CAST 4YDX4 CTTN HI CHSV (CAST SUPPLIES) IMPLANT
PADDING CAST COTTON 4X4 STRL (CAST SUPPLIES)
PADDING CAST COTTON 6X4 STRL (CAST SUPPLIES) ×1 IMPLANT
PROBE BIPOLAR ATHRO 135MM 90D (MISCELLANEOUS) IMPLANT
SET ARTHROSCOPY TUBING (MISCELLANEOUS) ×2
SET ARTHROSCOPY TUBING LN (MISCELLANEOUS) ×1 IMPLANT
SUT ETHILON 4 0 PS 2 18 (SUTURE) IMPLANT
SYR 10ML LL (SYRINGE) ×2 IMPLANT
TOWEL OR 17X24 6PK STRL BLUE (TOWEL DISPOSABLE) ×4 IMPLANT
TUBE CONNECTING 12X1/4 (SUCTIONS) IMPLANT
WATER STERILE IRR 500ML POUR (IV SOLUTION) ×2 IMPLANT

## 2017-09-26 NOTE — Op Note (Signed)
NAME: AUGUSTEN, LIPKIN MEDICAL RECORD IA:[id number] ACCOUNT [id number] DATE OF BIRTH:1961-01-11 FACILITY: [facility] LOCATION: [facility] PHYSICIAN:Rhylin Venters L. Syvanna Ciolino, MD  OPERATIVE REPORT  DATE OF PROCEDURE:  09/26/2017  PREOPERATIVE DIAGNOSIS:  Chondromalacia, medial and patellofemoral.  POSTOPERATIVE DIAGNOSIS:   1.  Chondromalacia, medial and patellofemoral. 2.  Medial meniscal tear. 3.  Medial and lateral plicas.  PROCEDURES: 1.  Left knee arthroscopy with chondroplasty medial and patellofemoral. 2.  Partial medial meniscectomy. 3.  Medial plica excision. 4.  Lateral plica excision.  SURGEON:  Dorna Leitz, MD  ASSISTANT: Gaspar Skeeters, PA-C   ANESTHESIA:  General.  BRIEF HISTORY:  The patient is a 57 year old male with a long history of left knee pain that has been treated conservatively for a period of time.  After failure of conservative care, he was taken to the operating room for left knee arthroscopy.   Preoperative MRI showed that he had significant chondromalacia and some meniscal degeneration without frank tear.  He was brought to the operating room for evaluation and fixation as needed.  DESCRIPTION OF PROCEDURE:  The patient brought to the operating room after adequate anesthesia obtained with general anesthetic.  The patient was placed on the operating room table.  The left leg was prepped and draped in the usual sterile fashion.   Following this, routine arthroscopic examination of knee revealed that there was significant chondromalacia patellofemoral joint, grade 3 and grade 4 in nature.  This was debrided back to a smooth and stable rim of articular cartilage.  A large medial  shelf plica was identified and debrided, almost like a synovial proliferation.  This was debrided to allow access into the medial compartment, there was a posterior horn medial meniscal tear which was debrided back to a smooth stable rim.  Medial femoral  condyle had unfortunately some flaps which  were debrided back to a smooth and stable rim of articular cartilage.  Once this was done, attention was turned laterally where the lateral plica was debrided.  At this point, the knee was copiously and  thoroughly lavaged with pulsatile lavage irrigation and suctioned dry, closed in a satisfactory condition.  AN/NUANCE  D:09/26/2017 T:09/26/2017 JOB:[id number]

## 2017-09-26 NOTE — Discharge Instructions (Signed)
POST-OP KNEE ARTHROSCOPY INSTRUCTIONS  Dr. Alain Marion PA-C  Pain You will be expected to have a moderate amount of pain in the affected knee for approximately two weeks. However, the first two days will be the most severe pain. A prescription has been provided to take as needed for the pain. The pain can be reduced by applying ice packs to the knee for the first 1-2 weeks post surgery. Also, keeping the leg elevated on pillows will help alleviate the pain. If you develop any acute pain or swelling in your calf muscle, please call the doctor.  Activity It is preferred that you stay at bed rest for approximately 24 hours. However, you may go to the bathroom with help. Weight bearing as tolerated. You may begin the knee exercises the day of surgery. Discontinue crutches as the knee pain resolves.  Dressing Keep the dressing dry. If the ace bandage should wrinkle or roll up, this can be rewrapped to prevent ridges in the bandage. You may remove all dressings in 48 hours,  apply bandaids to each wound. You may shower on the 4th day after surgery but no tub bath.  Symptoms to report to your doctor Extreme pain Extreme swelling Temperature above 101 degrees Change in the feeling, color, or movement of your toes Redness, heat, or swelling at your incision  Exercise If is preferred that as soon as possible you try to do a straight leg raise without bending the knee and concentrate on bringing the heel of your foot off the bed up to approximately 45 degrees and hold for the count of 10 seconds. Repeat this at least 10 times three or four times per day. Additional exercises are provided below.  You are encouraged to bend the knee as tolerated.  Follow-Up Call to schedule a follow-up appointment in 5-7 days. Our office # is [phone].  POST-OP EXERCISES  Short Arc Quads  1. Lie on back with legs straight. Place towel roll under thigh, just above knee. 2. Tighten thigh muscles to  straighten knee and lift heel off bed. 3. Hold for slow count of five, then lower. 4. Do three sets of ten    Straight Leg Raises  1. Lie on back with operative leg straight and other leg bent. 2. Keeping operative leg completely straight, slowly lift operative leg so foot is 5 inches off bed. 3. Hold for slow count of five, then lower. 4. Do three sets of ten.    DO BOTH EXERCISES 2 TIMES A DAY  Ankle Pumps  Work/move the operative ankle and foot up and down 10 times every hour while awake.   Post Anesthesia Home Care Instructions  Activity: Get plenty of rest for the remainder of the day. A responsible individual must stay with you for 24 hours following the procedure.  For the next 24 hours, DO NOT: -Drive a car -Paediatric nurse -Drink alcoholic beverages -Take any medication unless instructed by your physician -Make any legal decisions or sign important papers.  Meals: Start with liquid foods such as gelatin or soup. Progress to regular foods as tolerated. Avoid greasy, spicy, heavy foods. If nausea and/or vomiting occur, drink only clear liquids until the nausea and/or vomiting subsides. Call your physician if vomiting continues.  Special Instructions/Symptoms: Your throat may feel dry or sore from the anesthesia or the breathing tube placed in your throat during surgery. If this causes discomfort, gargle with warm salt water. The discomfort should disappear within 24 hours.

## 2017-09-26 NOTE — Anesthesia Postprocedure Evaluation (Signed)
Anesthesia Post Note  Patient: John Calhoun  Procedure(s) Performed: ARTHROSCOPY LEFT  KNEE, MEDIAL MENISCECTOMY , MEDIAL PLICA, CHONDRAPLASTY, PATELLAER COMPARTMET CHONDRAPLASTY (Left Knee)     Patient location during evaluation: PACU Anesthesia Type: General Level of consciousness: awake and alert Pain management: pain level controlled Vital Signs Assessment: post-procedure vital signs reviewed and stable Respiratory status: spontaneous breathing, nonlabored ventilation, respiratory function stable and patient connected to nasal cannula oxygen Cardiovascular status: blood pressure returned to baseline and stable Postop Assessment: no apparent nausea or vomiting Anesthetic complications: no    Last Vitals:  Vitals:   09/26/17 1115 09/26/17 1130  BP: (!) 154/95 (!) 155/87  Pulse: 61 62  Resp: 15 18  Temp:    SpO2: 99% 98%    Last Pain:  Vitals:   09/26/17 1130  TempSrc:   PainSc: 2                  Effie Berkshire

## 2017-09-26 NOTE — Anesthesia Preprocedure Evaluation (Addendum)
Anesthesia Evaluation  Patient identified by MRN, date of birth, ID band Patient awake    Reviewed: Allergy & Precautions, NPO status , Patient's Chart, lab work & pertinent test results  Airway Mallampati: II  TM Distance: >3 FB Neck ROM: Full    Dental  (+) Teeth Intact, Dental Advisory Given   Pulmonary former smoker,    breath sounds clear to auscultation       Cardiovascular hypertension, Pt. on medications  Rhythm:Regular Rate:Normal     Neuro/Psych negative neurological ROS     GI/Hepatic negative GI ROS, (+) Hepatitis -, C  Endo/Other  negative endocrine ROS  Renal/GU negative Renal ROS     Musculoskeletal  (+) Arthritis , Osteoarthritis,    Abdominal Normal abdominal exam  (+)   Peds  Hematology negative hematology ROS (+)   Anesthesia Other Findings - Cervical Spondylolysis  Reproductive/Obstetrics                            Lab Results  Component Value Date   WBC 9.9 08/02/2012   HGB 14.9 08/02/2012   HCT 46.0 08/02/2012   MCV 93.2 08/02/2012   PLT 202 07/27/2008   EKG: normal sinus rhythm.  Anesthesia Physical Anesthesia Plan  ASA: II  Anesthesia Plan: General   Post-op Pain Management:    Induction:   PONV Risk Score and Plan: 3 and Ondansetron, Dexamethasone and Midazolam  Airway Management Planned: LMA  Additional Equipment: None  Intra-op Plan:   Post-operative Plan: Extubation in OR  Informed Consent: I have reviewed the patients History and Physical, chart, labs and discussed the procedure including the risks, benefits and alternatives for the proposed anesthesia with the patient or authorized representative who has indicated his/her understanding and acceptance.   Dental advisory given  Plan Discussed with: CRNA  Anesthesia Plan Comments:        Anesthesia Quick Evaluation

## 2017-09-26 NOTE — Brief Op Note (Signed)
09/26/2017  10:24 AM  PATIENT:  Tania Ade  57 y.o. male  PRE-OPERATIVE DIAGNOSIS:  LEFT KNEE CHONDROMALACIA  POST-OPERATIVE DIAGNOSIS:  LEFT KNEE CHONDROMALACIA  PROCEDURE:  Procedure(s): ARTHROSCOPY LEFT  KNEE, MEDIAL MENISCECTOMY , MEDIAL PLICA, CHONDRAPLASTY, PATELLAER COMPARTMET CHONDRAPLASTY (Left)  SURGEON:  Surgeon(s) and Role:    Dorna Leitz, MD - Primary  PHYSICIAN ASSISTANT:   ASSISTANTS: jim bethune   ANESTHESIA:   spinal  EBL:  0 mL   BLOOD ADMINISTERED:none  DRAINS: none   LOCAL MEDICATIONS USED:  MARCAINE     SPECIMEN:  No Specimen  DISPOSITION OF SPECIMEN:  N/A  COUNTS:  YES  TOURNIQUET:  * No tourniquets in log *  DICTATION: .Other Dictation: Dictation Number [id number]  PLAN OF CARE: Discharge to home after PACU  PATIENT DISPOSITION:  PACU - hemodynamically stable.   Delay start of Pharmacological VTE agent (>24hrs) due to surgical blood loss or risk of bleeding: no

## 2017-09-26 NOTE — Transfer of Care (Signed)
  Last Vitals:  Vitals Value Taken Time  BP    Temp    Pulse    Resp    SpO2      Last Pain:  Vitals:   09/26/17 0850  TempSrc:   PainSc: 0-No pain      Patients Stated Pain Goal: 6 (09/26/17 0850)  Immediate Anesthesia Transfer of Care Note  Patient: DMARION PERFECT  Procedure(s) Performed: Procedure(s) (LRB): ARTHROSCOPY LEFT  KNEE, MEDIAL MENISCECTOMY , MEDIAL PLICA, CHONDRAPLASTY, PATELLAER COMPARTMET CHONDRAPLASTY (Left)  Patient Location: PACU  Anesthesia Type: General  Level of Consciousness: awake, alert  and oriented  Airway & Oxygen Therapy: Patient Spontanous Breathing and Patient connected to nasal cannula oxygen  Post-op Assessment: Report given to PACU RN and Post -op Vital signs reviewed and stable  Post vital signs: Reviewed and stable  Complications: No apparent anesthesia complications

## 2017-09-26 NOTE — Anesthesia Procedure Notes (Signed)
Procedure Name: LMA Insertion Date/Time: 09/26/2017 9:50 AM Performed by: Effie Berkshire, MD Pre-anesthesia Checklist: Patient identified, Emergency Drugs available, Suction available and Patient being monitored Patient Re-evaluated:Patient Re-evaluated prior to induction Oxygen Delivery Method: Circle system utilized Preoxygenation: Pre-oxygenation with 100% oxygen Induction Type: IV induction Ventilation: Mask ventilation without difficulty LMA: LMA inserted LMA Size: 5.0 Number of attempts: 1 Airway Equipment and Method: Bite block Placement Confirmation: positive ETCO2 Tube secured with: Tape Dental Injury: Teeth and Oropharynx as per pre-operative assessment

## 2017-09-30 ENCOUNTER — Encounter (HOSPITAL_BASED_OUTPATIENT_CLINIC_OR_DEPARTMENT_OTHER): Payer: Self-pay | Admitting: Orthopedic Surgery

## 2017-10-06 DIAGNOSIS — Z9889 Other specified postprocedural states: Secondary | ICD-10-CM | POA: Diagnosis not present

## 2017-10-13 DIAGNOSIS — M25562 Pain in left knee: Secondary | ICD-10-CM | POA: Diagnosis not present

## 2017-10-22 DIAGNOSIS — M25462 Effusion, left knee: Secondary | ICD-10-CM | POA: Diagnosis not present

## 2017-12-08 DIAGNOSIS — Z125 Encounter for screening for malignant neoplasm of prostate: Secondary | ICD-10-CM | POA: Diagnosis not present

## 2017-12-08 DIAGNOSIS — I10 Essential (primary) hypertension: Secondary | ICD-10-CM | POA: Diagnosis not present

## 2017-12-08 DIAGNOSIS — N529 Male erectile dysfunction, unspecified: Secondary | ICD-10-CM | POA: Diagnosis not present

## 2017-12-08 DIAGNOSIS — Z0000 Encounter for general adult medical examination without abnormal findings: Secondary | ICD-10-CM | POA: Diagnosis not present

## 2018-02-06 ENCOUNTER — Other Ambulatory Visit: Payer: Self-pay | Admitting: Family Medicine

## 2018-02-06 ENCOUNTER — Ambulatory Visit
Admission: RE | Admit: 2018-02-06 | Discharge: 2018-02-06 | Disposition: A | Discharge status: Home / Self Care | Payer: BLUE CROSS/BLUE SHIELD | Source: Ambulatory Visit | Attending: Family Medicine | Admitting: Family Medicine

## 2018-02-06 DIAGNOSIS — M79672 Pain in left foot: Secondary | ICD-10-CM

## 2018-02-06 DIAGNOSIS — M19072 Primary osteoarthritis, left ankle and foot: Secondary | ICD-10-CM | POA: Diagnosis not present

## 2018-02-20 DIAGNOSIS — M722 Plantar fascial fibromatosis: Secondary | ICD-10-CM | POA: Diagnosis not present

## 2018-04-08 DIAGNOSIS — I10 Essential (primary) hypertension: Secondary | ICD-10-CM | POA: Diagnosis not present

## 2018-04-08 DIAGNOSIS — J069 Acute upper respiratory infection, unspecified: Secondary | ICD-10-CM | POA: Diagnosis not present

## 2018-05-25 DIAGNOSIS — I10 Essential (primary) hypertension: Secondary | ICD-10-CM | POA: Diagnosis not present

## 2018-05-25 DIAGNOSIS — M109 Gout, unspecified: Secondary | ICD-10-CM | POA: Diagnosis not present

## 2018-05-25 DIAGNOSIS — Z131 Encounter for screening for diabetes mellitus: Secondary | ICD-10-CM | POA: Diagnosis not present

## 2018-05-25 DIAGNOSIS — H538 Other visual disturbances: Secondary | ICD-10-CM | POA: Diagnosis not present

## 2018-06-08 NOTE — Progress Notes (Signed)
Virtual Visit via Video Note The purpose of this virtual visit is to provide medical care while limiting exposure to the novel coronavirus.    Consent was obtained for video visit:  Yes.   Answered questions that patient had about telehealth interaction:  Yes.   I discussed the limitations, risks, security and privacy concerns of performing an evaluation and management service by telemedicine. I also discussed with the patient that there may be a patient responsible charge related to this service. The patient expressed understanding and agreed to proceed.  Pt location: Home Physician Location: office Name of referring provider:  Donald Prose, MD I connected with John Calhoun at patients initiation/request on 06/09/2018 at  9:30 AM EDT by video enabled telemedicine application and verified that I am speaking with the correct person using two identifiers. Pt MRN:  [id number] Pt DOB:  [birth year] Video Participants:  John Calhoun   History of Present Illness:  John Calhoun is a 58 year old African American man with borderline diabetes and hypertension who presents for bilateral papilledema.  History supplemented by optometry notes.  About 2 weeks ago, he was walking in [place] when he had sudden onset visual disturbance described as a cloudiness in the corner of his right eye.  There was no associated headache, unsteady gait or unilateral numbness or weakness.  Symptoms in the right eye slightly improved but 2 days later, he developed worse cloudy vision in the left eye.  Last week, he was evaluated by by optometrist, Dr. Morton Peters, who appreciated bilateral papilledema on exam.  Since then, he reports a dull pressure behind his left eye that quickly resolves with Aleve but occurs about 3 times a week. He believes that the visual disturbance has slightly improved over the past couple of weeks.  He has history of mild headaches from time to time.  He denies family history of any  neurologic conditions.  Labs from 09/26/17 showed Na 142, K 3.6, Cl 105, CO2 24, glucose 102, BUN 10, Cr 0.90  Past Medical History: Past Medical History:  Diagnosis Date  . Chondromalacia of left knee   . DDD (degenerative disc disease), cervical   . Fatty liver    resolved now  . Gout   . Hepatitis    hepatitis c took harvoni 2016  . Hypertension   . Pre-diabetes    does not check cbg at home, hemaglobin a1c runs good per patient  . Spondylolysis    Cervical    Medications: Outpatient Encounter Medications as of 06/09/2018  Medication Sig  . allopurinol (ZYLOPRIM) 100 MG tablet Take 1 tablet (100 mg total) by mouth daily.  . colchicine 0.6 MG tablet Take 1 tablet (0.6 mg total) by mouth daily.  Marland Kitchen HYDROcodone-acetaminophen (NORCO) 5-325 MG tablet Take 1-2 tablets by mouth every 6 (six) hours as needed for moderate pain.  Marland Kitchen olmesartan (BENICAR) 20 MG tablet Take 20 mg by mouth daily.   No facility-administered encounter medications on file as of 06/09/2018.     Allergies: No Known Allergies  Family History: Family History  Problem Relation Age of Onset  . Heart disease Brother     Social History: Social History   Socioeconomic History  . Marital status: Married    Spouse name: Not on file  . Number of children: Not on file  . Years of education: Not on file  . Highest education level: Not on file  Occupational History  . Not on file  Social Needs  .  Financial resource strain: Not on file  . Food insecurity:    Worry: Not on file    Inability: Not on file  . Transportation needs:    Medical: Not on file    Non-medical: Not on file  Tobacco Use  . Smoking status: Former Smoker    Packs/day: 1.50    Years: 20.00    Pack years: 30.00    Types: Cigarettes  . Smokeless tobacco: Never Used  . Tobacco comment: quit 21 yrs ago  Substance and Sexual Activity  . Alcohol use: No  . Drug use: No  . Sexual activity: Not on file  Lifestyle  . Physical activity:     Days per week: Not on file    Minutes per session: Not on file  . Stress: Not on file  Relationships  . Social connections:    Talks on phone: Not on file    Gets together: Not on file    Attends religious service: Not on file    Active member of club or organization: Not on file    Attends meetings of clubs or organizations: Not on file    Relationship status: Not on file  . Intimate partner violence:    Fear of current or ex partner: Not on file    Emotionally abused: Not on file    Physically abused: Not on file    Forced sexual activity: Not on file  Other Topics Concern  . Not on file  Social History Narrative  . Not on file   Observations/Objective:   There were no vitals filed for this visit. No acute distress.  Alert and oriented.  Speech fluent and not dysarthric.  Language intact.  Eyes orthophoric on primary gaze.  Face symmetric.  Assessment and Plan:   Bilateral papilledema.   1.  MRI of brain and orbits with and without contrast to evaluate for intracranial abnormality 2.  Following MRI, may need to perform lumbar puncture. 3.  Further recommendations pending results.    Follow Up Instructions:    -I discussed the assessment and treatment plan with the patient. The patient was provided an opportunity to ask questions and all were answered. The patient agreed with the plan and demonstrated an understanding of the instructions.   The patient was advised to call back or seek an in-person evaluation if the symptoms worsen or if the condition fails to improve as anticipated.  Dudley Major, DO

## 2018-06-09 ENCOUNTER — Other Ambulatory Visit: Payer: Self-pay

## 2018-06-09 ENCOUNTER — Telehealth (INDEPENDENT_AMBULATORY_CARE_PROVIDER_SITE_OTHER): Payer: BC Managed Care – PPO | Admitting: Neurology

## 2018-06-09 ENCOUNTER — Encounter: Payer: Self-pay | Admitting: Neurology

## 2018-06-09 VITALS — Ht 72.0 in | Wt 255.0 lb

## 2018-06-09 DIAGNOSIS — H4710 Unspecified papilledema: Secondary | ICD-10-CM | POA: Diagnosis not present

## 2018-06-09 NOTE — Addendum Note (Signed)
Addended by: Clois Comber on: 06/09/2018 09:50 AM   Modules accepted: Orders

## 2018-06-24 ENCOUNTER — Other Ambulatory Visit: Payer: Self-pay | Admitting: Neurology

## 2018-06-27 ENCOUNTER — Other Ambulatory Visit: Payer: Self-pay

## 2018-06-27 ENCOUNTER — Ambulatory Visit
Admission: RE | Admit: 2018-06-27 | Discharge: 2018-06-27 | Disposition: A | Discharge status: Home / Self Care | Payer: BLUE CROSS/BLUE SHIELD | Source: Ambulatory Visit | Attending: Neurology | Admitting: Neurology

## 2018-06-27 DIAGNOSIS — H4710 Unspecified papilledema: Secondary | ICD-10-CM

## 2018-06-29 ENCOUNTER — Telehealth: Payer: Self-pay | Admitting: Neurology

## 2018-06-29 NOTE — Telephone Encounter (Addendum)
Called [facility], spoke with Prentiss Bells to inquire why the imaging appt was changed. She advised me that it was no longer correct to order an MRI brain w w/o attn orbits, it would need to be 2 separate studies. I asked to speak with a tech. I spoke with Remo Lipps. She said after reading the dr's notes, they thought it should be 2 separate studies. I advised it should only be one, but attention orbits. Remo Lipps will have the Pt rescheduled sooner than the 6/20 appt.   Called and advised Pt to be expecting a call.

## 2018-06-29 NOTE — Telephone Encounter (Signed)
Patient called regarding his MRI that was scheduled for 06/24/18. He said they have pushed his appointment out now 3 more weeks because he has to have another procedure with it. He is asking if our office can call and put a rush on it? Thanks

## 2018-06-30 ENCOUNTER — Ambulatory Visit
Admission: RE | Admit: 2018-06-30 | Discharge: 2018-06-30 | Disposition: A | Discharge status: Home / Self Care | Payer: BC Managed Care – PPO | Source: Ambulatory Visit | Attending: Neurology | Admitting: Neurology

## 2018-06-30 ENCOUNTER — Other Ambulatory Visit: Payer: Self-pay

## 2018-06-30 DIAGNOSIS — H538 Other visual disturbances: Secondary | ICD-10-CM | POA: Diagnosis not present

## 2018-06-30 MED ORDER — GADOBENATE DIMEGLUMINE 529 MG/ML IV SOLN
20.0000 mL | Freq: Once | INTRAVENOUS | Status: AC | PRN
  Administered 2018-06-30: 20 mL via INTRAVENOUS

## 2018-07-01 ENCOUNTER — Telehealth: Payer: Self-pay

## 2018-07-01 DIAGNOSIS — H4710 Unspecified papilledema: Secondary | ICD-10-CM

## 2018-07-01 NOTE — Addendum Note (Signed)
Addended by: Clois Comber on: 07/01/2018 10:02 AM   Modules accepted: Orders

## 2018-07-01 NOTE — Telephone Encounter (Signed)
Called and advised Pt of MRI results and LP recommendation.  Pt is agreeable and knows to expect a call from [facility] to schedule.  Called [facility], spoke with [facility]

## 2018-07-01 NOTE — Telephone Encounter (Signed)
Called [facility] for Pt to return call concerning MRI results.

## 2018-07-01 NOTE — Telephone Encounter (Signed)
Pt returned your call and would like a call back .. °

## 2018-07-01 NOTE — Telephone Encounter (Signed)
-----   Message from Pieter Partridge, DO sent at 07/01/2018  8:19 AM EDT ----- MRI of brain doesn't show any cause for increased head pressure.  As discussed, we will need to proceed with lumbar puncture to assess opening pressure and check CSF for cell count with diff, protein, glucose, and gram stain and culture.  He should make follow up appointment with me following test.

## 2018-07-03 IMAGING — DX DG CERVICAL SPINE COMPLETE 4+V
6 series · 6 of 6 positions shown · non-contrast
Comparison: None.

CLINICAL DATA: Cervical spine and neck pain radiating into left
site for 6 days. No no known injury. Initial encounter.

EXAM:
CERVICAL SPINE - COMPLETE 4+ VIEW

[c-spine lat]
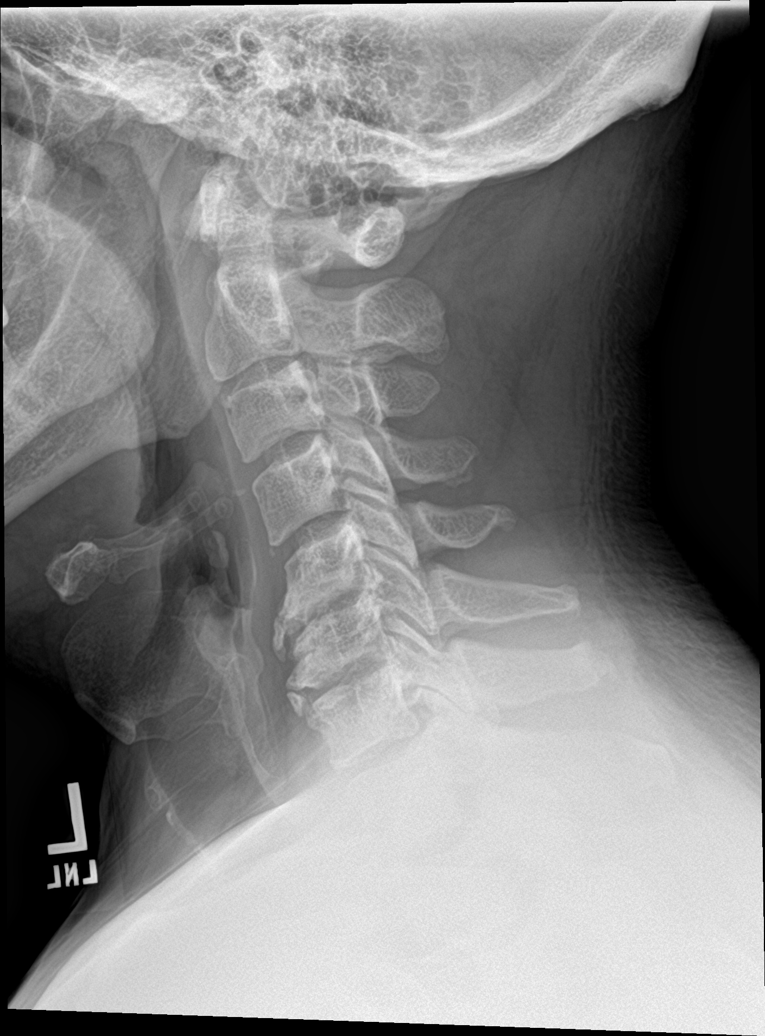

[c-spine obl (1 of 2)]
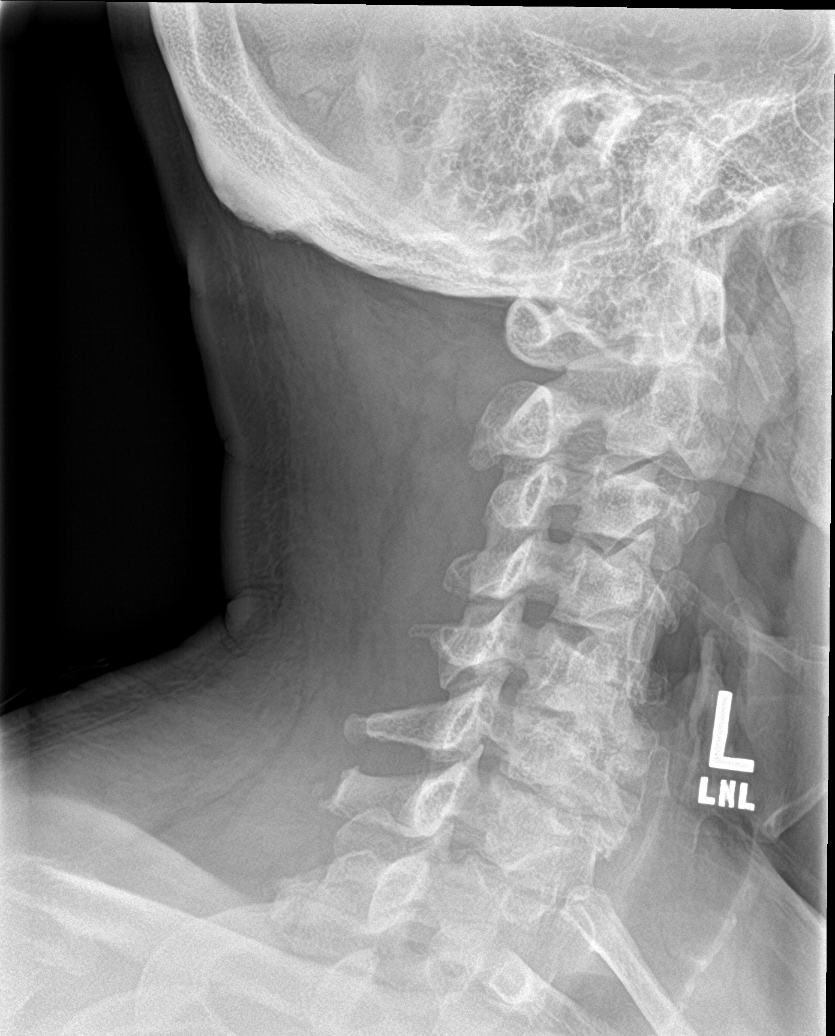

[c-spine obl (2 of 2)]
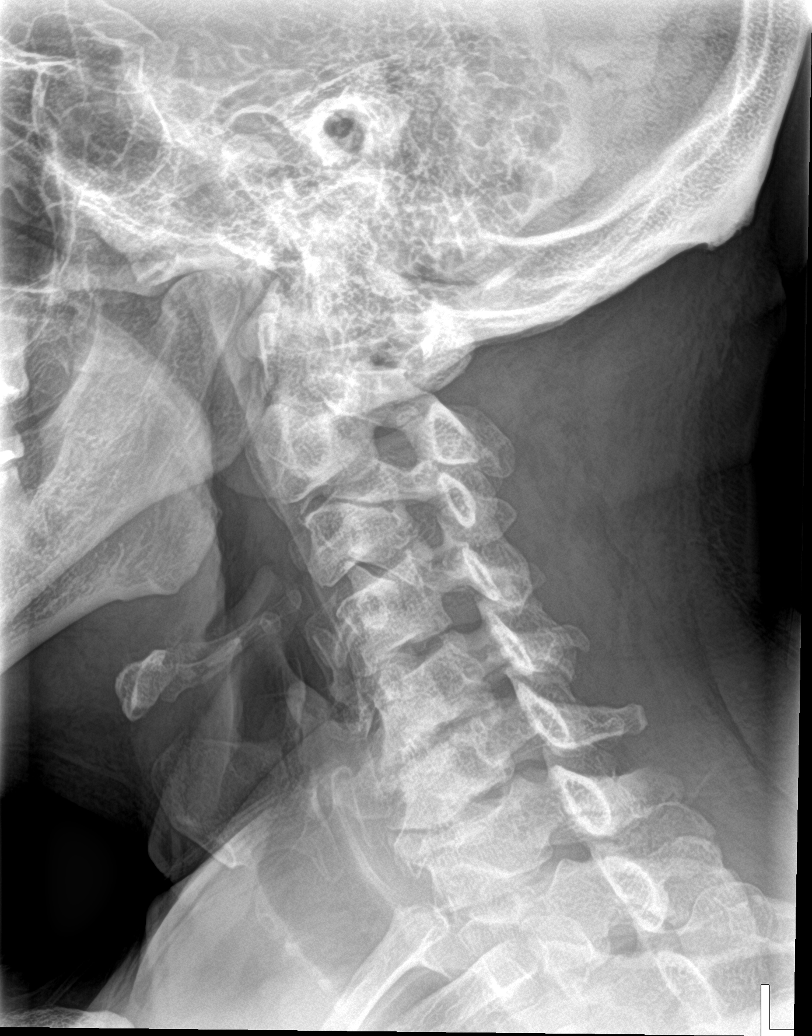

[c-spine ap]
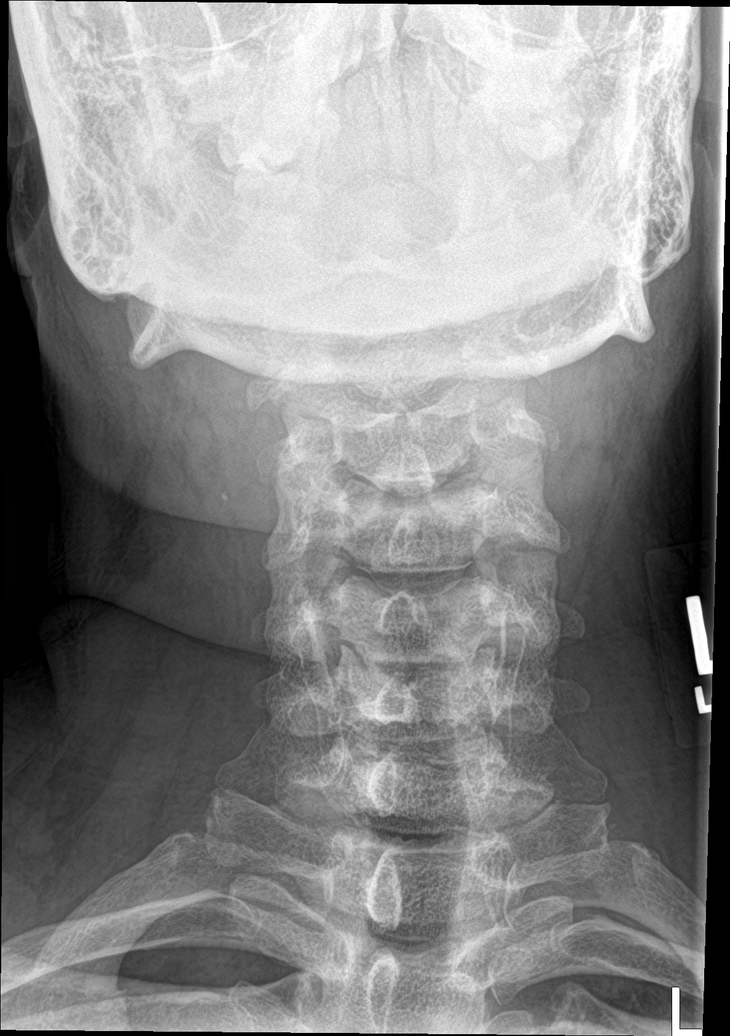

[c-spine open mouth]
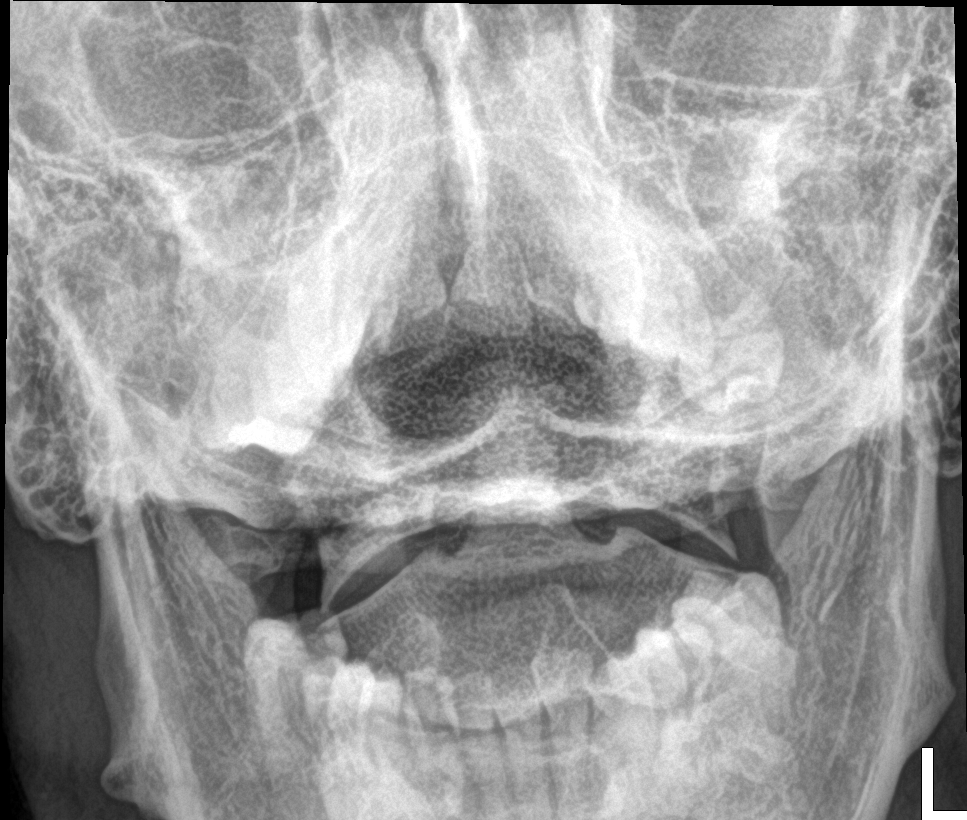

[[person_name]]
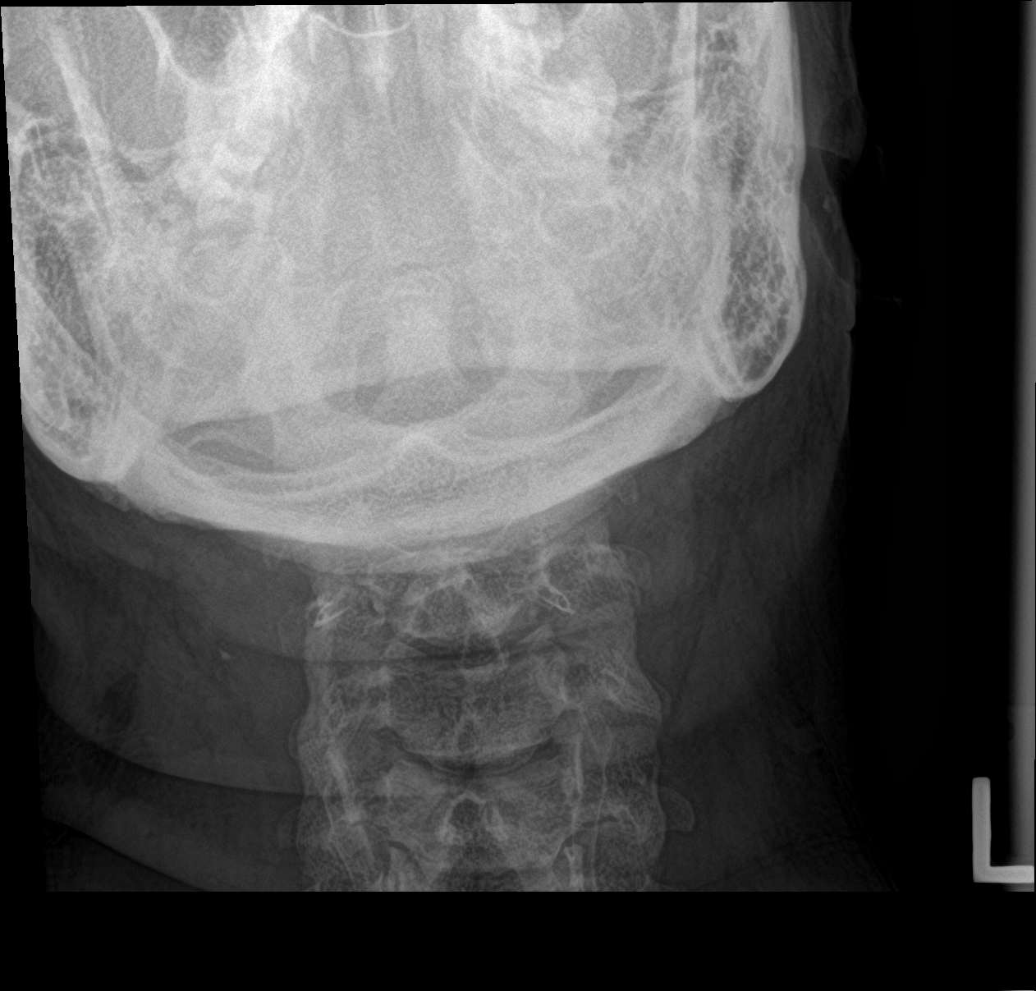

[6 of 6 positions shown; findings below may reference images not displayed]

FINDINGS: There is no evidence of acute fracture, subluxation or prevertebral
soft tissue swelling.

Moderate degenerative disc disease and spondylosis at C5-6 and C6-7
noted.

No significant bony foraminal narrowing noted.

No focal bony lesions are present.
IMPRESSION: Moderate degenerative disc disease and spondylosis at C5-6 and C6-7.

No acute abnormality.

## 2018-07-03 IMAGING — DX DG SHOULDER 2+V*L*
3 series · 3 of 3 positions shown · non-contrast
Comparison: 02/26/2011 MRI

CLINICAL DATA: Left shoulder pain for 6 days. No known recent
injury. Initial encounter.

EXAM:
LEFT SHOULDER - 2+ VIEW

[shoulder grashey]
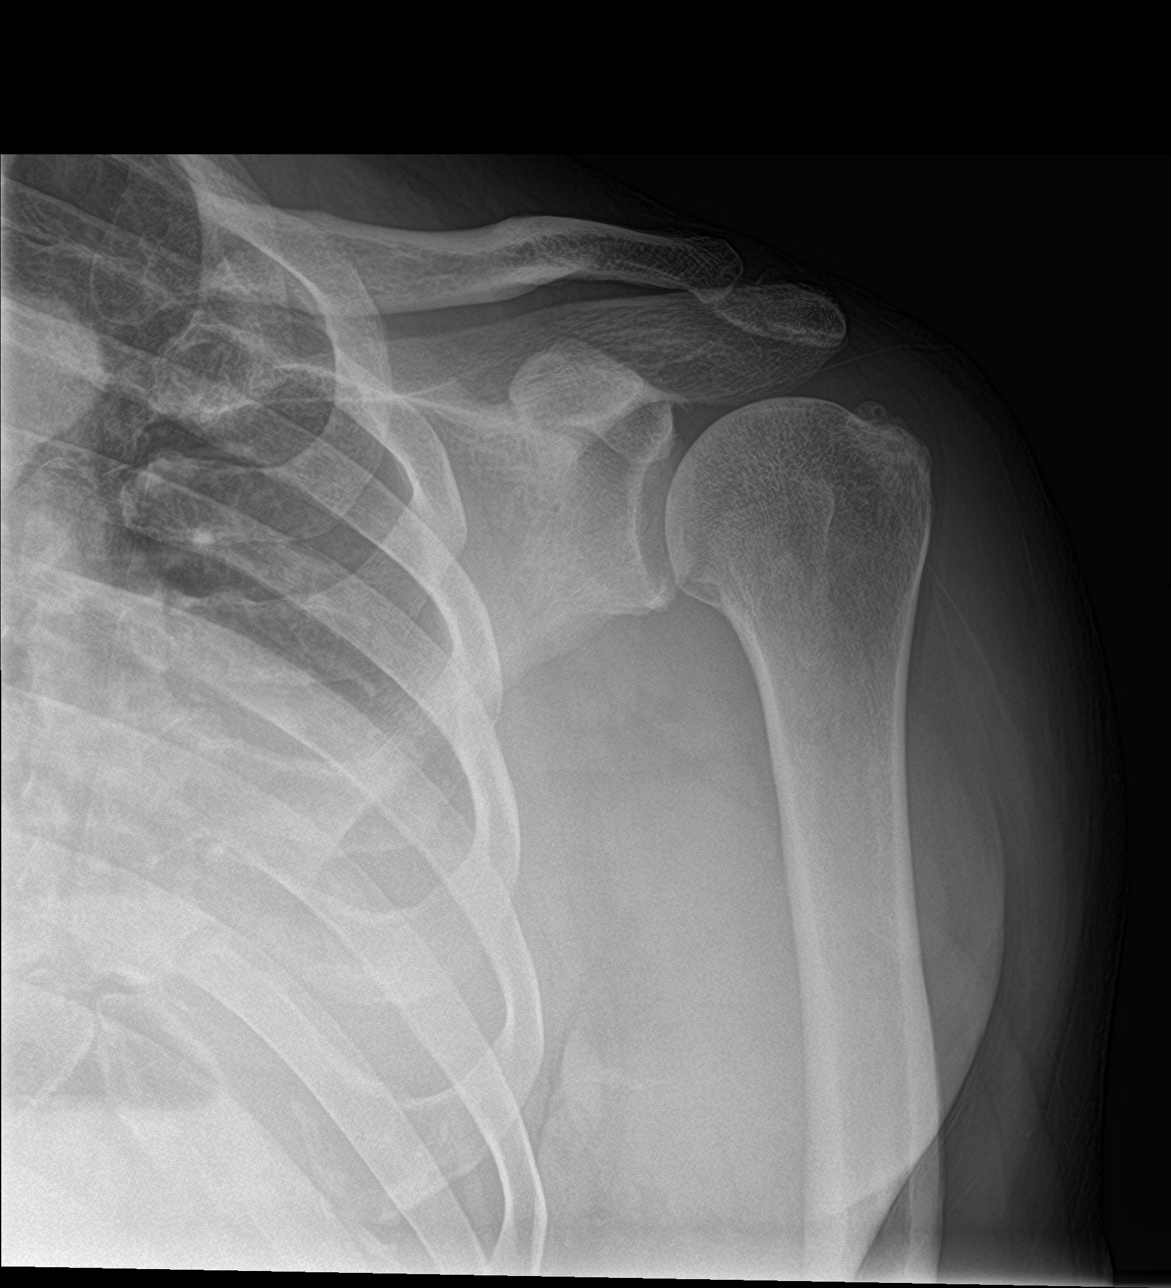

[shoulder y view]
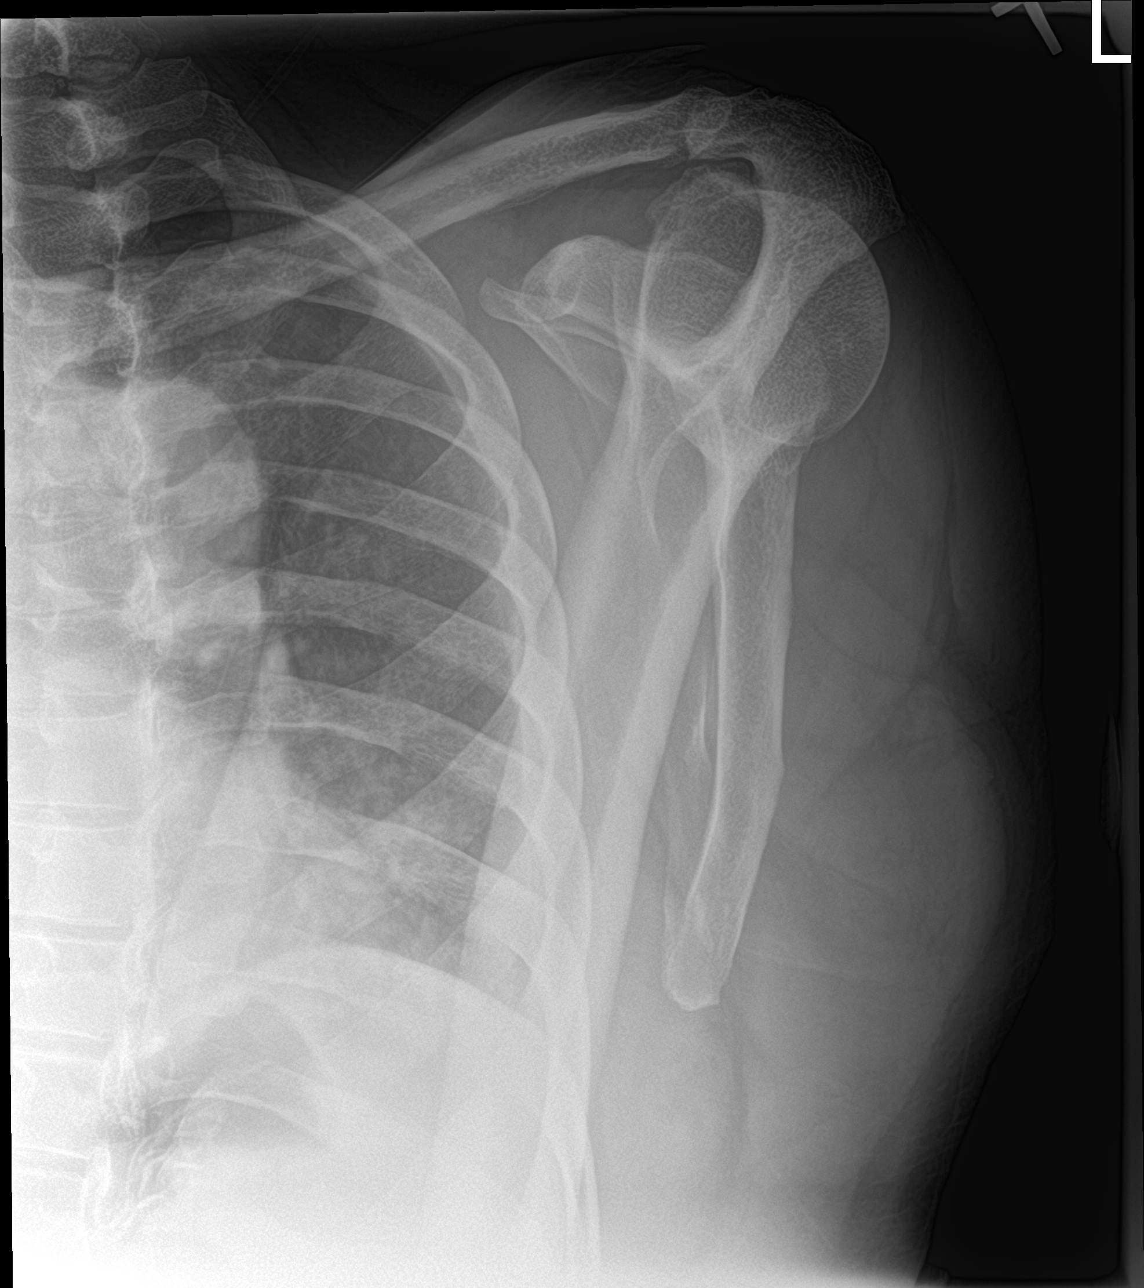

[shoulder axillary]
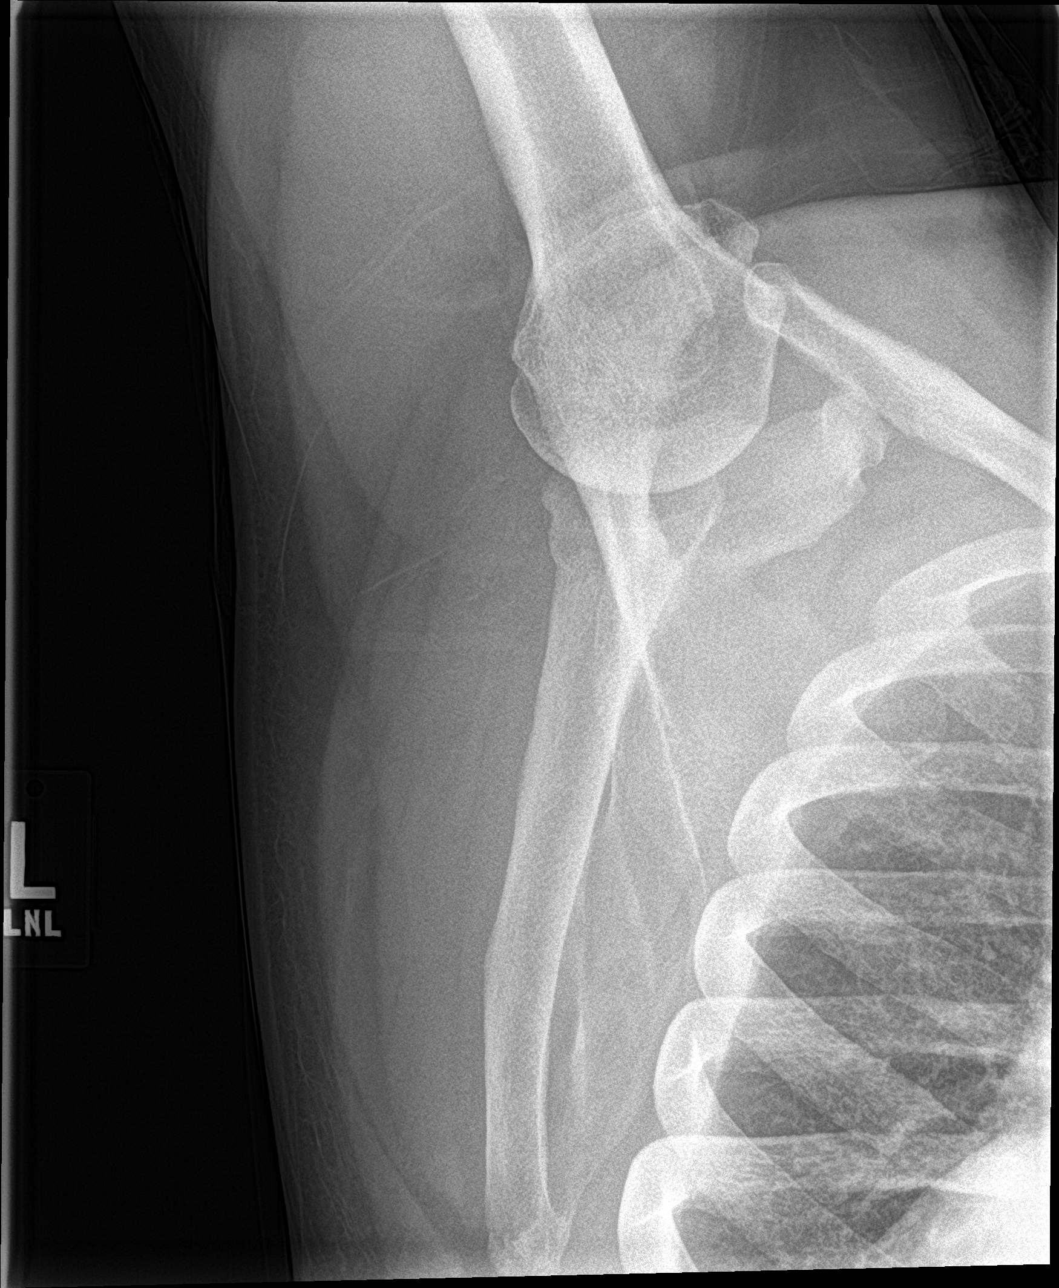

[3 of 3 positions shown; findings below may reference images not displayed]

FINDINGS: There is no evidence of fracture or dislocation. There is no
evidence of arthropathy or other focal bone abnormality. Soft
tissues are unremarkable.
IMPRESSION: Negative.

## 2018-07-18 ENCOUNTER — Other Ambulatory Visit: Payer: BLUE CROSS/BLUE SHIELD

## 2018-07-20 ENCOUNTER — Ambulatory Visit
Admission: RE | Admit: 2018-07-20 | Discharge: 2018-07-20 | Disposition: A | Discharge status: Home / Self Care | Payer: BC Managed Care – PPO | Source: Ambulatory Visit | Attending: Neurology | Admitting: Neurology

## 2018-07-20 ENCOUNTER — Other Ambulatory Visit: Payer: Self-pay

## 2018-07-20 VITALS — BP 122/87 | HR 61

## 2018-07-20 DIAGNOSIS — H4710 Unspecified papilledema: Secondary | ICD-10-CM

## 2018-07-20 NOTE — Discharge Instructions (Signed)
° °    Lumbar Puncture Discharge Instructions  1. Go home and rest quietly for the next 24 hours.  It is important to lie flat for the next 24 hours.  Get up only to go to the restroom.  You may lie in the bed or on a couch on your back, your stomach, your left side or your right side.  You may have one pillow under your head.  You may have pillows between your knees while you are on your side or under your knees while you are on your back.  2. DO NOT drive today.  Recline the seat as far back as it will go, while still wearing your seat belt, on the way home.  3. You may get up to go to the bathroom as needed.  You may sit up for 10 minutes to eat.  You may resume your normal diet and medications unless otherwise indicated.  Drink lots of extra fluids today and tomorrow.  4. The incidence of headache, nausea, or vomiting is about 5% (one in 20 patients).  If you develop a headache, lie flat and drink plenty of fluids until the headache goes away.  Caffeinated beverages may be helpful.  If you develop severe nausea and vomiting or a headache that does not go away with flat bed rest, call the physician who sent you here.   5. You may resume normal activities after your 24 hours of bed rest is over; however, do not exert yourself strongly or do any heavy lifting tomorrow.  6. Call your physician for a follow-up appointment.   7. If you have any questions  after you arrive home, please call [phone].  Discharge instructions have been explained to the patient.  The patient, or the person responsible for the patient, fully understands these instructions.  Your opening pressure was 23cm H2O, and the closing pressure was 15cm H2O.

## 2018-07-21 ENCOUNTER — Telehealth: Payer: Self-pay

## 2018-07-21 MED ORDER — ACETAZOLAMIDE ER 500 MG PO CP12: 500.0000 mg | ORAL_CAPSULE | Freq: Two times a day (BID) | ORAL | 3 refills | Status: DC

## 2018-07-21 NOTE — Telephone Encounter (Signed)
Informed patient of results.  Rx sent to pharmacy.  Advised patient to have eye exam in 6 weeks and then follow up with our office.  Patient verbalized understanding.

## 2018-07-21 NOTE — Telephone Encounter (Signed)
-----   Message from Pieter Partridge, DO sent at 07/21/2018  7:33 AM EDT ----- Pressure from spinal tap is mildly elevated. I would like him to start acetazolamide ER 500mg  twice daily.  He should have a repeat eye exam in 6 weeks (have the eye doctor fax over results to me).  I would like him to follow up with me after that.

## 2018-07-22 ENCOUNTER — Telehealth: Payer: Self-pay | Admitting: Neurology

## 2018-07-22 NOTE — Telephone Encounter (Signed)
Tingling and numbness in both hands- once yesterday around 5PM, he started taking the acetazolamide medication yesterday AM and he also felt it this morning at 4AM and it woke him up. Please call him back at [phone]. Thanks!

## 2018-07-22 NOTE — Telephone Encounter (Signed)
Informed patient that paraesthesias is a side effect of the medication but he should continue to take it to help treat the increased pressure in his head.  Patient understands and will call back if he experiences any other issues.

## 2018-07-22 NOTE — Telephone Encounter (Signed)
Yes, paresthesias is a side effect of acetazolamide.  I would like him to give it a chance as it is really the best medication for increased pressure in the head and it often resolve once his body gets used to the dose.

## 2018-07-22 NOTE — Telephone Encounter (Signed)
Is this normal for patient, what would you like me to advise him?

## 2018-07-24 ENCOUNTER — Telehealth: Payer: Self-pay | Admitting: Neurology

## 2018-07-24 NOTE — Telephone Encounter (Signed)
Advised patient Dr Tomi Likens wants him to continue the medication unless it is unbearable.  Patient is going to continue the medication and will call next week if not any better.

## 2018-07-24 NOTE — Telephone Encounter (Signed)
Pt called with continued concerns and additional concerns with new recent prescribed medication. Pt states blurred vision and thought he was being treated for blurred vision on the medication. Nausea last night and this morning. How long will these symptoms continue on this medication? What is the timeline for him to give it a fair chance.

## 2018-08-05 LAB — CSF CELL COUNT WITH DIFFERENTIAL
RBC Count, CSF: 2 cells/uL (ref 0–10)
WBC, CSF: 3 cells/uL (ref 0–5)

## 2018-08-05 LAB — GLUCOSE, CSF: Glucose, CSF: 71 mg/dL (ref 40–80)

## 2018-08-05 LAB — PROTEIN, CSF: Total Protein, CSF: 34 mg/dL (ref 15–45)

## 2018-08-12 ENCOUNTER — Telehealth: Payer: Self-pay | Admitting: Neurology

## 2018-08-12 NOTE — Telephone Encounter (Signed)
Agreed -

## 2018-08-12 NOTE — Telephone Encounter (Signed)
New Message  Patient verbalized of procedure 3-4 weeks ago and has not seen any immediate improvements and would like to speak to nurse or MD in regards to when he will start seeing progress.

## 2018-08-12 NOTE — Telephone Encounter (Signed)
Called and spoke with Pt. We discussed the frequency of headaches. He states they have decreased, though are still present and bothersome, happening 2 or 3 days a week; vision is still "off". He has an eye appointment 09/02/18. The paraesthesia has lessened somewhat, but also still present. He said he can deal with that, but the headaches are his most concern right now. I advised him he should allow at least 4 weeks for the medication to be most effective.

## 2018-12-28 ENCOUNTER — Telehealth: Payer: Self-pay | Admitting: Neurology

## 2018-12-28 ENCOUNTER — Other Ambulatory Visit: Payer: Self-pay

## 2018-12-28 NOTE — Telephone Encounter (Signed)
Patient left msg with after hours about needing to cancel a prescription. He didn't give any more info than that. Thanks!

## 2018-12-28 NOTE — Telephone Encounter (Signed)
That's fine.  After stopping the Diamox, he should have his eyes reexamined in about 8 weeks to make sure that there is no recurrence.

## 2018-12-28 NOTE — Telephone Encounter (Signed)
Left message for patient to callback regarding his medication.

## 2018-12-28 NOTE — Telephone Encounter (Signed)
Spoke with patient he states that he went to eye specialist in [place] he was told that he no longer needs to take Diamox. Pt requesting Rx be removed from medication list. He is no longer taken this medication.   Please be aware

## 2018-12-28 NOTE — Telephone Encounter (Signed)
Spoke with patient he was informed of response and agrees

## 2020-01-20 ENCOUNTER — Other Ambulatory Visit: Payer: Self-pay | Admitting: Neurology

## 2021-04-23 DIAGNOSIS — Z0000 Encounter for general adult medical examination without abnormal findings: Secondary | ICD-10-CM | POA: Diagnosis not present

## 2021-04-23 DIAGNOSIS — I10 Essential (primary) hypertension: Secondary | ICD-10-CM | POA: Diagnosis not present

## 2021-04-23 DIAGNOSIS — Z125 Encounter for screening for malignant neoplasm of prostate: Secondary | ICD-10-CM | POA: Diagnosis not present

## 2021-04-23 DIAGNOSIS — N529 Male erectile dysfunction, unspecified: Secondary | ICD-10-CM | POA: Diagnosis not present

## 2021-10-23 DIAGNOSIS — I10 Essential (primary) hypertension: Secondary | ICD-10-CM | POA: Diagnosis not present

## 2021-10-23 DIAGNOSIS — E538 Deficiency of other specified B group vitamins: Secondary | ICD-10-CM | POA: Diagnosis not present

## 2021-10-23 DIAGNOSIS — M109 Gout, unspecified: Secondary | ICD-10-CM | POA: Diagnosis not present

## 2021-10-23 DIAGNOSIS — N529 Male erectile dysfunction, unspecified: Secondary | ICD-10-CM | POA: Diagnosis not present

## 2021-10-23 DIAGNOSIS — R7303 Prediabetes: Secondary | ICD-10-CM | POA: Diagnosis not present

## 2021-10-30 DIAGNOSIS — M1711 Unilateral primary osteoarthritis, right knee: Secondary | ICD-10-CM | POA: Diagnosis not present

## 2021-10-30 DIAGNOSIS — M25461 Effusion, right knee: Secondary | ICD-10-CM | POA: Diagnosis not present

## 2021-11-13 DIAGNOSIS — M1711 Unilateral primary osteoarthritis, right knee: Secondary | ICD-10-CM | POA: Diagnosis not present

## 2021-11-19 DIAGNOSIS — Z01818 Encounter for other preprocedural examination: Secondary | ICD-10-CM | POA: Diagnosis not present

## 2021-11-19 DIAGNOSIS — I10 Essential (primary) hypertension: Secondary | ICD-10-CM | POA: Diagnosis not present

## 2021-11-19 DIAGNOSIS — M1711 Unilateral primary osteoarthritis, right knee: Secondary | ICD-10-CM | POA: Diagnosis not present

## 2021-11-19 DIAGNOSIS — R7303 Prediabetes: Secondary | ICD-10-CM | POA: Diagnosis not present

## 2021-11-29 DIAGNOSIS — M1731 Unilateral post-traumatic osteoarthritis, right knee: Secondary | ICD-10-CM | POA: Diagnosis not present

## 2021-11-29 DIAGNOSIS — R262 Difficulty in walking, not elsewhere classified: Secondary | ICD-10-CM | POA: Diagnosis not present

## 2021-11-29 DIAGNOSIS — M25661 Stiffness of right knee, not elsewhere classified: Secondary | ICD-10-CM | POA: Diagnosis not present

## 2022-01-02 DIAGNOSIS — M1711 Unilateral primary osteoarthritis, right knee: Secondary | ICD-10-CM | POA: Diagnosis not present

## 2022-01-11 DIAGNOSIS — M21161 Varus deformity, not elsewhere classified, right knee: Secondary | ICD-10-CM | POA: Diagnosis not present

## 2022-01-11 DIAGNOSIS — G8918 Other acute postprocedural pain: Secondary | ICD-10-CM | POA: Diagnosis not present

## 2022-01-11 DIAGNOSIS — M1711 Unilateral primary osteoarthritis, right knee: Secondary | ICD-10-CM | POA: Diagnosis not present

## 2022-01-14 DIAGNOSIS — Z96651 Presence of right artificial knee joint: Secondary | ICD-10-CM | POA: Diagnosis not present

## 2022-01-14 DIAGNOSIS — M25661 Stiffness of right knee, not elsewhere classified: Secondary | ICD-10-CM | POA: Diagnosis not present

## 2022-01-17 DIAGNOSIS — Z96651 Presence of right artificial knee joint: Secondary | ICD-10-CM | POA: Diagnosis not present

## 2022-01-17 DIAGNOSIS — M25661 Stiffness of right knee, not elsewhere classified: Secondary | ICD-10-CM | POA: Diagnosis not present

## 2022-01-22 DIAGNOSIS — Z96651 Presence of right artificial knee joint: Secondary | ICD-10-CM | POA: Diagnosis not present

## 2022-01-22 DIAGNOSIS — M25661 Stiffness of right knee, not elsewhere classified: Secondary | ICD-10-CM | POA: Diagnosis not present

## 2022-01-23 DIAGNOSIS — M1711 Unilateral primary osteoarthritis, right knee: Secondary | ICD-10-CM | POA: Diagnosis not present

## 2022-01-24 DIAGNOSIS — M25661 Stiffness of right knee, not elsewhere classified: Secondary | ICD-10-CM | POA: Diagnosis not present

## 2022-01-24 DIAGNOSIS — Z96651 Presence of right artificial knee joint: Secondary | ICD-10-CM | POA: Diagnosis not present

## 2022-01-29 DIAGNOSIS — Z96651 Presence of right artificial knee joint: Secondary | ICD-10-CM | POA: Diagnosis not present

## 2022-01-29 DIAGNOSIS — M25661 Stiffness of right knee, not elsewhere classified: Secondary | ICD-10-CM | POA: Diagnosis not present

## 2022-03-13 DIAGNOSIS — H47213 Primary optic atrophy, bilateral: Secondary | ICD-10-CM | POA: Diagnosis not present

## 2022-03-13 DIAGNOSIS — H2513 Age-related nuclear cataract, bilateral: Secondary | ICD-10-CM | POA: Diagnosis not present

## 2022-04-09 DIAGNOSIS — M25661 Stiffness of right knee, not elsewhere classified: Secondary | ICD-10-CM | POA: Diagnosis not present

## 2022-07-09 DIAGNOSIS — Z96651 Presence of right artificial knee joint: Secondary | ICD-10-CM | POA: Diagnosis not present

## 2022-07-09 DIAGNOSIS — M25461 Effusion, right knee: Secondary | ICD-10-CM | POA: Diagnosis not present

## 2022-07-09 DIAGNOSIS — M25561 Pain in right knee: Secondary | ICD-10-CM | POA: Diagnosis not present

## 2022-07-16 DIAGNOSIS — M25561 Pain in right knee: Secondary | ICD-10-CM | POA: Diagnosis not present

## 2022-07-16 DIAGNOSIS — M25461 Effusion, right knee: Secondary | ICD-10-CM | POA: Diagnosis not present

## 2022-07-29 DIAGNOSIS — M109 Gout, unspecified: Secondary | ICD-10-CM | POA: Diagnosis not present

## 2022-07-29 DIAGNOSIS — I10 Essential (primary) hypertension: Secondary | ICD-10-CM | POA: Diagnosis not present

## 2022-07-29 DIAGNOSIS — R7303 Prediabetes: Secondary | ICD-10-CM | POA: Diagnosis not present

## 2022-07-29 DIAGNOSIS — N529 Male erectile dysfunction, unspecified: Secondary | ICD-10-CM | POA: Diagnosis not present

## 2022-10-30 DIAGNOSIS — H2513 Age-related nuclear cataract, bilateral: Secondary | ICD-10-CM | POA: Diagnosis not present

## 2022-10-30 DIAGNOSIS — H47213 Primary optic atrophy, bilateral: Secondary | ICD-10-CM | POA: Diagnosis not present

## 2023-01-16 DIAGNOSIS — M25561 Pain in right knee: Secondary | ICD-10-CM | POA: Diagnosis not present

## 2023-06-15 ENCOUNTER — Ambulatory Visit (INDEPENDENT_AMBULATORY_CARE_PROVIDER_SITE_OTHER)

## 2023-06-15 ENCOUNTER — Encounter: Payer: Self-pay | Admitting: Emergency Medicine

## 2023-06-15 ENCOUNTER — Ambulatory Visit
Admission: EM | Admit: 2023-06-15 | Discharge: 2023-06-15 | Disposition: A | Discharge status: Home / Self Care | Attending: Internal Medicine | Admitting: Internal Medicine

## 2023-06-15 DIAGNOSIS — R051 Acute cough: Secondary | ICD-10-CM | POA: Diagnosis not present

## 2023-06-15 DIAGNOSIS — J22 Unspecified acute lower respiratory infection: Secondary | ICD-10-CM | POA: Diagnosis not present

## 2023-06-15 LAB — POCT INFLUENZA A/B
Influenza A, POC: NEGATIVE
Influenza B, POC: NEGATIVE

## 2023-06-15 MED ORDER — PROMETHAZINE-DM 6.25-15 MG/5ML PO SYRP: 5.0000 mL | ORAL_SOLUTION | Freq: Three times a day (TID) | ORAL | 0 refills | Status: AC | PRN

## 2023-06-15 MED ORDER — PREDNISONE 20 MG PO TABS: 40.0000 mg | ORAL_TABLET | Freq: Every day | ORAL | 0 refills | Status: AC

## 2023-06-15 MED ORDER — AZITHROMYCIN 250 MG PO TABS: ORAL_TABLET | ORAL | 0 refills | Status: AC

## 2023-06-15 NOTE — ED Triage Notes (Signed)
 Pt c/o sxs listed x 2 days. Pt denies emesis and diarrhea. Pt says his at home COVID test was negative.

## 2023-06-15 NOTE — ED Provider Notes (Addendum)
 [facility]    CSN: [id number] Arrival date & time: 06/15/23  0807      History   Chief Complaint Chief Complaint  Patient presents with   Generalized Body Aches    HPI John Calhoun is a 63 y.o. male.   63 year old male who presents urgent care with complaints of severe cough, chills, fevers, body aches and itchy throat.  His symptoms started about 2 days ago.  He has also had a slight headache.  Around 3 AM this morning he started having significant chills with fever and bodyaches.  He did a COVID test at this time that was negative.  His cough is much worse at night and better during the day.  His throat is itchy but he is not having any trouble swallowing.  He is still eating and drinking well.  He denies any chest pain, shortness of breath, ear pain, congestion, sick contacts or other constitutional symptoms.  He has been using over-the-counter medication without relief.        Past Medical History:  Diagnosis Date   Chondromalacia of left knee    DDD (degenerative disc disease), cervical    Fatty liver    resolved now   Gout    Hepatitis    hepatitis c took harvoni 2016   Hypertension    Pre-diabetes    does not check cbg at home, hemaglobin a1c runs good per patient   Spondylolysis    Cervical    There are no active problems to display for this patient.   Past Surgical History:  Procedure Laterality Date   CERVICAL FUSION  2017   C 3    KNEE ARTHROSCOPY Left 09/26/2017   Procedure: ARTHROSCOPY LEFT  KNEE, MEDIAL MENISCECTOMY , MEDIAL PLICA, CHONDRAPLASTY, PATELLAER COMPARTMET CHONDRAPLASTY;  Surgeon: Neil Balls, MD;  Location: [facility];  Service: Orthopedics;  Laterality: Left;       Home Medications    Prior to Admission medications   Medication Sig Start Date End Date Taking? Authorizing Provider  allopurinol  (ZYLOPRIM ) 100 MG tablet Take 1 tablet (100 mg total) by mouth daily. 08/19/12  Yes Dunn, Elvia Hammans, PA-C   azithromycin (ZITHROMAX) 250 MG tablet Take first 2 tablets together, then 1 every day until finished. 06/15/23  Yes Adeline Petitfrere A, PA-C  B Complex Vitamins (VITAMIN B COMPLEX) TABS Take 1 tablet by mouth daily at 2 PM.   Yes [provider]  predniSONE  (DELTASONE ) 20 MG tablet Take 2 tablets (40 mg total) by mouth daily with breakfast for 5 days. 06/15/23 06/20/23 Yes Lautaro Koral A, PA-C  promethazine -dextromethorphan (PROMETHAZINE -DM) 6.25-15 MG/5ML syrup Take 5 mLs by mouth every 8 (eight) hours as needed for cough. 06/15/23  Yes Macarena Langseth A, PA-C  cyanocobalamin  (VITAMIN B12) 1000 MCG tablet Take 1 tablet by mouth daily. 03/02/22   [provider]  fluticasone (FLONASE) 50 MCG/ACT nasal spray Place 1 spray into both nostrils daily. 01/13/21   [provider]  HYDROcodone -acetaminophen  (NORCO) 5-325 MG tablet Take 1-2 tablets by mouth every 6 (six) hours as needed for moderate pain. 09/26/17   Doris Garnet, PA-C  indomethacin (INDOCIN) 50 MG capsule Take 50 mg by mouth 3 (three) times daily as needed. 04/26/15   [provider]  olmesartan (BENICAR) 20 MG tablet Take 20 mg by mouth daily.    [provider]  sildenafil (VIAGRA) 100 MG tablet Take 100 mg by mouth as needed for erectile dysfunction. 10/28/18   [provider]    Family History Family History  Problem Relation Age of Onset   Dementia Mother    Heart attack Father    Heart disease Brother     Social History Social History   Tobacco Use   Smoking status: Former    Current packs/day: 1.50    Average packs/day: 1.5 packs/day for 20.0 years (30.0 ttl pk-yrs)    Types: Cigarettes    Passive exposure: Past   Smokeless tobacco: Never   Tobacco comments:    quit 21 yrs ago  Vaping Use   Vaping status: Never Used  Substance Use Topics   Alcohol use: No   Drug use: No     Allergies   Patient has no known allergies.   Review of Systems Review of  Systems  Constitutional:  Positive for chills and fever.  HENT:  Positive for sore throat. Negative for ear pain.   Eyes:  Negative for pain and visual disturbance.  Respiratory:  Positive for cough. Negative for shortness of breath.   Cardiovascular:  Negative for chest pain and palpitations.  Gastrointestinal:  Negative for abdominal pain and vomiting.  Genitourinary:  Negative for dysuria and hematuria.  Musculoskeletal:  Negative for arthralgias and back pain.       Body aches  Skin:  Negative for color change and rash.  Neurological:  Positive for headaches. Negative for seizures and syncope.  All other systems reviewed and are negative.    Physical Exam Triage Vital Signs ED Triage Vitals  Encounter Vitals Group     BP 06/15/23 0848 135/78     Systolic BP Percentile --      Diastolic BP Percentile --      Pulse Rate 06/15/23 0848 (!) 111     Resp 06/15/23 0848 18     Temp 06/15/23 0848 99.4 F (37.4 C)     Temp Source 06/15/23 0848 Oral     SpO2 06/15/23 0848 97 %     Weight 06/15/23 0847 255 lb 1.2 oz (115.7 kg)     Height --      Head Circumference --      Peak Flow --      Pain Score 06/15/23 0846 4     Pain Loc --      Pain Education --      Exclude from Growth Chart --    No data found.  Updated Vital Signs BP 135/78 (BP Location: Right Arm)   Pulse (!) 111   Temp 99.4 F (37.4 C) (Oral)   Resp 18   Wt 255 lb 1.2 oz (115.7 kg)   SpO2 97%   BMI 34.59 kg/m   Visual Acuity Right Eye Distance:   Left Eye Distance:   Bilateral Distance:    Right Eye Near:   Left Eye Near:    Bilateral Near:     Physical Exam Vitals and nursing note reviewed.  Constitutional:      General: He is not in acute distress.    Appearance: He is well-developed.  HENT:     Head: Normocephalic and atraumatic.     Right Ear: Tympanic membrane normal.     Left Ear: Tympanic membrane normal.     Nose: Nose normal.     Mouth/Throat:     Mouth: Mucous membranes are moist.      Pharynx: No posterior oropharyngeal erythema.  Eyes:     Conjunctiva/sclera: Conjunctivae normal.  Cardiovascular:     Rate and Rhythm:  Normal rate and regular rhythm.     Heart sounds: No murmur heard. Pulmonary:     Effort: Pulmonary effort is normal. No tachypnea or respiratory distress.     Breath sounds: Examination of the right-upper field reveals rhonchi. Examination of the right-middle field reveals rhonchi. Examination of the right-lower field reveals rhonchi. Rhonchi present. No decreased breath sounds or wheezing.  Abdominal:     Palpations: Abdomen is soft.     Tenderness: There is no abdominal tenderness.  Musculoskeletal:        General: No swelling.     Cervical back: Neck supple.  Skin:    General: Skin is warm and dry.     Capillary Refill: Capillary refill takes less than 2 seconds.  Neurological:     General: No focal deficit present.     Mental Status: He is alert.  Psychiatric:        Mood and Affect: Mood normal.      UC Treatments / Results  Labs (all labs ordered are listed, but only abnormal results are displayed) Labs Reviewed  POCT INFLUENZA A/B - Normal    EKG   Radiology DG Chest 2 View Result Date: 06/15/2023 CLINICAL DATA:  Cough. EXAM: CHEST - 2 VIEW COMPARISON:  July 06, 2008. FINDINGS: The heart size and mediastinal contours are within normal limits. Both lungs are clear. The visualized skeletal structures are unremarkable. IMPRESSION: No active cardiopulmonary disease. Electronically Signed   By: Rosalene Colon M.D.   On: 06/15/2023 09:31    Procedures Procedures (including critical care time)  Medications Ordered in UC Medications - No data to display  Initial Impression / Assessment and Plan / UC Course  I have reviewed the triage vital signs and the nursing notes.  Pertinent labs & imaging results that were available during my care of the patient were reviewed by me and considered in my medical decision making (see chart  for details).     Acute cough - Plan: DG Chest 2 View, DG Chest 2 View  Lower respiratory infection   Chest x-ray done today.  Final evaluation by the radiologist shows no acute changes however given the physical exam findings and coarse breath sounds on the right we will treat for a lower respiratory infection. He is also mildly tachycardiac and has a low grade fever. We will treat with the following: Azithromycin 250mg  Take 2 tablets today and the 1 tablet daily for 4 more days.  Prednisone  40 mg (2 tablets) once daily for 5 days. Take this in the morning.    Promethazine  DM 5 mL every 8 hours as needed for cough.  Use caution as this medication can cause drowsiness.  Rest and stay hydrated.   Return to urgent care or PCP if symptoms worsen or fail to resolve.    Final Clinical Impressions(s) / UC Diagnoses   Final diagnoses:  Acute cough  Lower respiratory infection     Discharge Instructions      Chest x-ray done today.  Final evaluation by the radiologist shows no acute changes however given the physical exam findings and coarse breath sounds on the right we will treat for a lower respiratory infection.  We will treat with the following: Azithromycin 250mg  Take 2 tablets today and the 1 tablet daily for 4 more days.  Prednisone  40 mg (2 tablets) once daily for 5 days. Take this in the morning.  This is a steroid to help with inflammation and pain.  Promethazine  DM  5 mL every 8 hours as needed for cough.  Use caution as this medication can cause drowsiness.  Rest and stay hydrated.   Return to urgent care or PCP if symptoms worsen or fail to resolve.     ED Prescriptions     Medication Sig Dispense Auth. Provider   azithromycin (ZITHROMAX) 250 MG tablet Take first 2 tablets together, then 1 every day until finished. 6 tablet Locke Barrell A, PA-C   predniSONE  (DELTASONE ) 20 MG tablet Take 2 tablets (40 mg total) by mouth daily with breakfast for 5 days. 10 tablet Harshini Trent,  Margree Gimbel A, PA-C   promethazine -dextromethorphan (PROMETHAZINE -DM) 6.25-15 MG/5ML syrup Take 5 mLs by mouth every 8 (eight) hours as needed for cough. 180 mL Kreg Pesa, [place]      PDMP not reviewed this encounter.   Acie Acosta 06/15/23 0946    Kreg Pesa, PA-C 06/15/23 [phone]

## 2023-06-15 NOTE — Discharge Instructions (Addendum)
 Chest x-ray done today.  Final evaluation by the radiologist shows no acute changes however given the physical exam findings and coarse breath sounds on the right we will treat for a lower respiratory infection.  We will treat with the following: Azithromycin 250mg  Take 2 tablets today and the 1 tablet daily for 4 more days.  Prednisone  40 mg (2 tablets) once daily for 5 days. Take this in the morning.  This is a steroid to help with inflammation and pain.  Promethazine  DM 5 mL every 8 hours as needed for cough.  Use caution as this medication can cause drowsiness.  Rest and stay hydrated.   Return to urgent care or PCP if symptoms worsen or fail to resolve.
